# Patient Record
Sex: Male | Born: 1940 | Race: White | Hispanic: No | Marital: Married | Smoking: Former smoker
Health system: Southern US, Community
[De-identification: ages and names within clinical notes are randomized; demographics above are authoritative.]

## PROBLEM LIST (undated history)

## (undated) DIAGNOSIS — E119 Type 2 diabetes mellitus without complications: Secondary | ICD-10-CM

---

## 2020-09-16 ENCOUNTER — Emergency Department: Payer: Medicare (Managed Care)

## 2020-09-16 ENCOUNTER — Encounter: Payer: Self-pay | Admitting: Emergency Medicine

## 2020-09-16 ENCOUNTER — Inpatient Hospital Stay
Admission: EM | Admit: 2020-09-16 | Discharge: 2020-09-30 | DRG: 853 | Disposition: A | Discharge status: Home / Self Care | Payer: Medicare (Managed Care) | Attending: Family Medicine | Admitting: Family Medicine

## 2020-09-16 ENCOUNTER — Other Ambulatory Visit: Payer: Self-pay

## 2020-09-16 DIAGNOSIS — C7A1 Malignant poorly differentiated neuroendocrine tumors: Secondary | ICD-10-CM | POA: Diagnosis present

## 2020-09-16 DIAGNOSIS — R918 Other nonspecific abnormal finding of lung field: Secondary | ICD-10-CM

## 2020-09-16 DIAGNOSIS — R1900 Intra-abdominal and pelvic swelling, mass and lump, unspecified site: Secondary | ICD-10-CM

## 2020-09-16 DIAGNOSIS — R591 Generalized enlarged lymph nodes: Secondary | ICD-10-CM

## 2020-09-16 DIAGNOSIS — C7931 Secondary malignant neoplasm of brain: Secondary | ICD-10-CM | POA: Diagnosis present

## 2020-09-16 DIAGNOSIS — N390 Urinary tract infection, site not specified: Secondary | ICD-10-CM | POA: Diagnosis present

## 2020-09-16 DIAGNOSIS — C774 Secondary and unspecified malignant neoplasm of inguinal and lower limb lymph nodes: Secondary | ICD-10-CM | POA: Diagnosis present

## 2020-09-16 DIAGNOSIS — E222 Syndrome of inappropriate secretion of antidiuretic hormone: Secondary | ICD-10-CM | POA: Diagnosis present

## 2020-09-16 DIAGNOSIS — Z6820 Body mass index (BMI) 20.0-20.9, adult: Secondary | ICD-10-CM

## 2020-09-16 DIAGNOSIS — D649 Anemia, unspecified: Secondary | ICD-10-CM | POA: Diagnosis present

## 2020-09-16 DIAGNOSIS — C20 Malignant neoplasm of rectum: Secondary | ICD-10-CM | POA: Diagnosis present

## 2020-09-16 DIAGNOSIS — Z539 Procedure and treatment not carried out, unspecified reason: Secondary | ICD-10-CM | POA: Diagnosis present

## 2020-09-16 DIAGNOSIS — R4182 Altered mental status, unspecified: Secondary | ICD-10-CM | POA: Diagnosis not present

## 2020-09-16 DIAGNOSIS — A419 Sepsis, unspecified organism: Secondary | ICD-10-CM

## 2020-09-16 DIAGNOSIS — A4102 Sepsis due to Methicillin resistant Staphylococcus aureus: Secondary | ICD-10-CM | POA: Diagnosis not present

## 2020-09-16 DIAGNOSIS — N136 Pyonephrosis: Secondary | ICD-10-CM | POA: Diagnosis present

## 2020-09-16 DIAGNOSIS — D509 Iron deficiency anemia, unspecified: Secondary | ICD-10-CM | POA: Diagnosis present

## 2020-09-16 DIAGNOSIS — G9341 Metabolic encephalopathy: Secondary | ICD-10-CM | POA: Diagnosis present

## 2020-09-16 DIAGNOSIS — R590 Localized enlarged lymph nodes: Secondary | ICD-10-CM

## 2020-09-16 DIAGNOSIS — I10 Essential (primary) hypertension: Secondary | ICD-10-CM | POA: Diagnosis present

## 2020-09-16 DIAGNOSIS — E1165 Type 2 diabetes mellitus with hyperglycemia: Secondary | ICD-10-CM | POA: Diagnosis present

## 2020-09-16 DIAGNOSIS — F05 Delirium due to known physiological condition: Secondary | ICD-10-CM | POA: Diagnosis present

## 2020-09-16 DIAGNOSIS — Z87891 Personal history of nicotine dependence: Secondary | ICD-10-CM

## 2020-09-16 DIAGNOSIS — E871 Hypo-osmolality and hyponatremia: Secondary | ICD-10-CM

## 2020-09-16 DIAGNOSIS — K449 Diaphragmatic hernia without obstruction or gangrene: Secondary | ICD-10-CM | POA: Diagnosis present

## 2020-09-16 DIAGNOSIS — Z20822 Contact with and (suspected) exposure to covid-19: Secondary | ICD-10-CM | POA: Diagnosis present

## 2020-09-16 DIAGNOSIS — Z515 Encounter for palliative care: Secondary | ICD-10-CM

## 2020-09-16 DIAGNOSIS — E43 Unspecified severe protein-calorie malnutrition: Secondary | ICD-10-CM | POA: Diagnosis present

## 2020-09-16 DIAGNOSIS — N1330 Unspecified hydronephrosis: Secondary | ICD-10-CM

## 2020-09-16 DIAGNOSIS — C61 Malignant neoplasm of prostate: Secondary | ICD-10-CM | POA: Diagnosis present

## 2020-09-16 DIAGNOSIS — R6520 Severe sepsis without septic shock: Secondary | ICD-10-CM | POA: Diagnosis present

## 2020-09-16 DIAGNOSIS — F0390 Unspecified dementia without behavioral disturbance: Secondary | ICD-10-CM | POA: Diagnosis present

## 2020-09-16 DIAGNOSIS — R339 Retention of urine, unspecified: Secondary | ICD-10-CM

## 2020-09-16 DIAGNOSIS — R410 Disorientation, unspecified: Secondary | ICD-10-CM

## 2020-09-16 DIAGNOSIS — E876 Hypokalemia: Secondary | ICD-10-CM | POA: Diagnosis not present

## 2020-09-16 HISTORY — DX: Type 2 diabetes mellitus without complications: E11.9

## 2020-09-16 LAB — CBC
HCT: 29.3 % — ABNORMAL LOW (ref 39.0–52.0)
Hemoglobin: 8.8 g/dL — ABNORMAL LOW (ref 13.0–17.0)
MCH: 23.5 pg — ABNORMAL LOW (ref 26.0–34.0)
MCHC: 30 g/dL (ref 30.0–36.0)
MCV: 78.1 fL — ABNORMAL LOW (ref 80.0–100.0)
Platelets: 353 10*3/uL (ref 150–400)
RBC: 3.75 MIL/uL — ABNORMAL LOW (ref 4.22–5.81)
RDW: 16.7 % — ABNORMAL HIGH (ref 11.5–15.5)
WBC: 14.6 10*3/uL — ABNORMAL HIGH (ref 4.0–10.5)
nRBC: 0 % (ref 0.0–0.2)

## 2020-09-16 LAB — COMPREHENSIVE METABOLIC PANEL
ALT: 13 U/L (ref 0–44)
AST: 21 U/L (ref 15–41)
Albumin: 2.7 g/dL — ABNORMAL LOW (ref 3.5–5.0)
Alkaline Phosphatase: 108 U/L (ref 38–126)
Anion gap: 10 (ref 5–15)
BUN: 20 mg/dL (ref 8–23)
CO2: 23 mmol/L (ref 22–32)
Calcium: 8.2 mg/dL — ABNORMAL LOW (ref 8.9–10.3)
Chloride: 99 mmol/L (ref 98–111)
Creatinine, Ser: 1.03 mg/dL (ref 0.61–1.24)
GFR, Estimated: >60 mL/min (ref >60)
Glucose, Bld: 170 mg/dL — ABNORMAL HIGH (ref 70–99)
Potassium: 3.5 mmol/L (ref 3.5–5.1)
Sodium: 132 mmol/L — ABNORMAL LOW (ref 135–145)
Total Bilirubin: 0.6 mg/dL (ref 0.3–1.2)
Total Protein: 8.1 g/dL (ref 6.5–8.1)

## 2020-09-16 LAB — RESP PANEL BY RT-PCR (FLU A&B, COVID) ARPGX2
Influenza A by PCR: NEGATIVE
Influenza B by PCR: NEGATIVE
SARS Coronavirus 2 by RT PCR: NEGATIVE

## 2020-09-16 LAB — URINALYSIS, COMPLETE (UACMP) WITH MICROSCOPIC
Bilirubin Urine: NEGATIVE
Glucose, UA: NEGATIVE mg/dL
Ketones, ur: NEGATIVE mg/dL
Nitrite: NEGATIVE
Protein, ur: >=300 mg/dL — AB
Specific Gravity, Urine: 1.027 (ref 1.005–1.030)
Squamous Epithelial / HPF: NONE SEEN (ref 0–5)
pH: 5 (ref 5.0–8.0)

## 2020-09-16 LAB — ETHANOL: Alcohol, Ethyl (B): <10 mg/dL (ref <10)

## 2020-09-16 LAB — LACTIC ACID, PLASMA: Lactic Acid, Venous: 1.8 mmol/L (ref 0.5–1.9)

## 2020-09-16 MED ORDER — LACTATED RINGERS IV BOLUS (SEPSIS)
1000.0000 mL | Freq: Once | INTRAVENOUS | Status: AC
  Administered 2020-09-16: 1000 mL via INTRAVENOUS

## 2020-09-16 MED ORDER — INSULIN ASPART 100 UNIT/ML ~~LOC~~ SOLN
0.0000 [IU] | Freq: Three times a day (TID) | SUBCUTANEOUS | Status: DC
  Administered 2020-09-17 – 2020-09-21 (×3): 1 [IU] via SUBCUTANEOUS
  Administered 2020-09-21 – 2020-09-23 (×2): 2 [IU] via SUBCUTANEOUS
  Administered 2020-09-23: 1 [IU] via SUBCUTANEOUS
  Administered 2020-09-24: 2 [IU] via SUBCUTANEOUS
  Administered 2020-09-24 (×2): 5 [IU] via SUBCUTANEOUS
  Administered 2020-09-25: 3 [IU] via SUBCUTANEOUS
  Administered 2020-09-25: 1 [IU] via SUBCUTANEOUS
  Administered 2020-09-25 – 2020-09-26 (×2): 3 [IU] via SUBCUTANEOUS
  Administered 2020-09-26: 5 [IU] via SUBCUTANEOUS
  Administered 2020-09-26: 2 [IU] via SUBCUTANEOUS
  Administered 2020-09-27: 9 [IU] via SUBCUTANEOUS
  Administered 2020-09-27 – 2020-09-28 (×3): 2 [IU] via SUBCUTANEOUS
  Administered 2020-09-28: 3 [IU] via SUBCUTANEOUS
  Filled 2020-09-16 (×19): qty 1

## 2020-09-16 MED ORDER — SODIUM CHLORIDE 0.9 % IV SOLN
1.0000 g | INTRAVENOUS | Status: DC
  Administered 2020-09-16 – 2020-09-20 (×5): 1 g via INTRAVENOUS
  Filled 2020-09-16 (×3): qty 10
  Filled 2020-09-16 (×2): qty 1
  Filled 2020-09-16: qty 10

## 2020-09-16 MED ORDER — ONDANSETRON HCL 4 MG PO TABS: 4.0000 mg | ORAL_TABLET | Freq: Four times a day (QID) | ORAL | Status: DC | PRN

## 2020-09-16 MED ORDER — ACETAMINOPHEN 325 MG PO TABS
650.0000 mg | ORAL_TABLET | Freq: Four times a day (QID) | ORAL | Status: DC | PRN
  Administered 2020-09-17 – 2020-09-29 (×16): 650 mg via ORAL
  Filled 2020-09-16 (×18): qty 2

## 2020-09-16 MED ORDER — ONDANSETRON HCL 4 MG/2ML IJ SOLN: 4.0000 mg | Freq: Four times a day (QID) | INTRAMUSCULAR | Status: DC | PRN

## 2020-09-16 MED ORDER — LACTATED RINGERS IV BOLUS (SEPSIS)
250.0000 mL | Freq: Once | INTRAVENOUS | Status: AC
  Administered 2020-09-16: 250 mL via INTRAVENOUS

## 2020-09-16 MED ORDER — ACETAMINOPHEN 650 MG RE SUPP: 650.0000 mg | Freq: Four times a day (QID) | RECTAL | Status: DC | PRN

## 2020-09-16 MED ORDER — ENOXAPARIN SODIUM 40 MG/0.4ML ~~LOC~~ SOLN
40.0000 mg | SUBCUTANEOUS | Status: DC
  Administered 2020-09-17 – 2020-09-24 (×8): 40 mg via SUBCUTANEOUS
  Filled 2020-09-16 (×8): qty 0.4

## 2020-09-16 MED ORDER — ACETAMINOPHEN 325 MG PO TABS
ORAL_TABLET | ORAL | Status: AC
  Administered 2020-09-17: 650 mg via ORAL
  Filled 2020-09-16: qty 2

## 2020-09-16 MED ORDER — ACETAMINOPHEN 325 MG PO TABS
650.0000 mg | ORAL_TABLET | Freq: Once | ORAL | Status: AC
  Administered 2020-09-16: 650 mg via ORAL
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
## 2020-09-16 NOTE — ED Notes (Signed)
Pt ambulatory to toilet without difficulty. Pt back in bed given pillow and warm blankets. Lights dimmed for rest. Pt denies further needs at this time.
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
## 2020-09-16 NOTE — ED Notes (Signed)
Pt transported to CT ?

## 2020-09-16 NOTE — Consult Note (Signed)
CODE SEPSIS - PHARMACY COMMUNICATION  **Broad Spectrum Antibiotics should be administered within 1 hour of Sepsis diagnosis**  Time Code Sepsis Called/Page Received: 2034  Antibiotics Ordered: 2033  Time of 1st antibiotic administration: 2047  Additional action taken by pharmacy: N/A  If necessary, Name of Provider/Nurse Contacted: N/A    Darnelle Bos ,PharmD Clinical Pharmacist  09/16/2020  8:36 PM
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
## 2020-09-17 DIAGNOSIS — G9341 Metabolic encephalopathy: Secondary | ICD-10-CM | POA: Diagnosis present

## 2020-09-17 DIAGNOSIS — E222 Syndrome of inappropriate secretion of antidiuretic hormone: Secondary | ICD-10-CM | POA: Diagnosis present

## 2020-09-17 DIAGNOSIS — I38 Endocarditis, valve unspecified: Secondary | ICD-10-CM | POA: Diagnosis not present

## 2020-09-17 DIAGNOSIS — C774 Secondary and unspecified malignant neoplasm of inguinal and lower limb lymph nodes: Secondary | ICD-10-CM | POA: Diagnosis present

## 2020-09-17 DIAGNOSIS — Z6820 Body mass index (BMI) 20.0-20.9, adult: Secondary | ICD-10-CM | POA: Diagnosis not present

## 2020-09-17 DIAGNOSIS — R7881 Bacteremia: Secondary | ICD-10-CM | POA: Diagnosis not present

## 2020-09-17 DIAGNOSIS — R410 Disorientation, unspecified: Secondary | ICD-10-CM | POA: Diagnosis not present

## 2020-09-17 DIAGNOSIS — C7A1 Malignant poorly differentiated neuroendocrine tumors: Secondary | ICD-10-CM | POA: Diagnosis present

## 2020-09-17 DIAGNOSIS — Z515 Encounter for palliative care: Secondary | ICD-10-CM | POA: Diagnosis not present

## 2020-09-17 DIAGNOSIS — D509 Iron deficiency anemia, unspecified: Secondary | ICD-10-CM

## 2020-09-17 DIAGNOSIS — E871 Hypo-osmolality and hyponatremia: Secondary | ICD-10-CM | POA: Diagnosis not present

## 2020-09-17 DIAGNOSIS — A4102 Sepsis due to Methicillin resistant Staphylococcus aureus: Secondary | ICD-10-CM | POA: Diagnosis present

## 2020-09-17 DIAGNOSIS — E876 Hypokalemia: Secondary | ICD-10-CM | POA: Diagnosis not present

## 2020-09-17 DIAGNOSIS — B9562 Methicillin resistant Staphylococcus aureus infection as the cause of diseases classified elsewhere: Secondary | ICD-10-CM

## 2020-09-17 DIAGNOSIS — Z87891 Personal history of nicotine dependence: Secondary | ICD-10-CM | POA: Diagnosis not present

## 2020-09-17 DIAGNOSIS — N132 Hydronephrosis with renal and ureteral calculous obstruction: Secondary | ICD-10-CM | POA: Diagnosis not present

## 2020-09-17 DIAGNOSIS — R6520 Severe sepsis without septic shock: Secondary | ICD-10-CM | POA: Diagnosis present

## 2020-09-17 DIAGNOSIS — D649 Anemia, unspecified: Secondary | ICD-10-CM

## 2020-09-17 DIAGNOSIS — Z20822 Contact with and (suspected) exposure to covid-19: Secondary | ICD-10-CM | POA: Diagnosis present

## 2020-09-17 DIAGNOSIS — R339 Retention of urine, unspecified: Secondary | ICD-10-CM | POA: Diagnosis not present

## 2020-09-17 DIAGNOSIS — N390 Urinary tract infection, site not specified: Secondary | ICD-10-CM

## 2020-09-17 DIAGNOSIS — G9340 Encephalopathy, unspecified: Secondary | ICD-10-CM | POA: Diagnosis not present

## 2020-09-17 DIAGNOSIS — Z539 Procedure and treatment not carried out, unspecified reason: Secondary | ICD-10-CM | POA: Diagnosis present

## 2020-09-17 DIAGNOSIS — A419 Sepsis, unspecified organism: Secondary | ICD-10-CM | POA: Diagnosis not present

## 2020-09-17 DIAGNOSIS — C61 Malignant neoplasm of prostate: Secondary | ICD-10-CM | POA: Diagnosis present

## 2020-09-17 DIAGNOSIS — R4182 Altered mental status, unspecified: Secondary | ICD-10-CM | POA: Diagnosis present

## 2020-09-17 DIAGNOSIS — I10 Essential (primary) hypertension: Secondary | ICD-10-CM | POA: Diagnosis present

## 2020-09-17 DIAGNOSIS — F05 Delirium due to known physiological condition: Secondary | ICD-10-CM | POA: Diagnosis present

## 2020-09-17 DIAGNOSIS — N1330 Unspecified hydronephrosis: Secondary | ICD-10-CM | POA: Diagnosis not present

## 2020-09-17 DIAGNOSIS — E1165 Type 2 diabetes mellitus with hyperglycemia: Secondary | ICD-10-CM | POA: Diagnosis present

## 2020-09-17 DIAGNOSIS — F0390 Unspecified dementia without behavioral disturbance: Secondary | ICD-10-CM | POA: Diagnosis present

## 2020-09-17 DIAGNOSIS — C20 Malignant neoplasm of rectum: Secondary | ICD-10-CM | POA: Diagnosis present

## 2020-09-17 DIAGNOSIS — R918 Other nonspecific abnormal finding of lung field: Secondary | ICD-10-CM | POA: Diagnosis not present

## 2020-09-17 DIAGNOSIS — N136 Pyonephrosis: Secondary | ICD-10-CM | POA: Diagnosis present

## 2020-09-17 DIAGNOSIS — E43 Unspecified severe protein-calorie malnutrition: Secondary | ICD-10-CM | POA: Diagnosis present

## 2020-09-17 DIAGNOSIS — K449 Diaphragmatic hernia without obstruction or gangrene: Secondary | ICD-10-CM | POA: Diagnosis present

## 2020-09-17 DIAGNOSIS — C7931 Secondary malignant neoplasm of brain: Secondary | ICD-10-CM | POA: Diagnosis present

## 2020-09-17 LAB — BLOOD CULTURE ID PANEL (REFLEXED) - BCID2

## 2020-09-17 LAB — URINE DRUG SCREEN, QUALITATIVE (ARMC ONLY)
Amphetamines, Ur Screen: NOT DETECTED
Barbiturates, Ur Screen: NOT DETECTED
Benzodiazepine, Ur Scrn: NOT DETECTED
Cannabinoid 50 Ng, Ur ~~LOC~~: NOT DETECTED
Cocaine Metabolite,Ur ~~LOC~~: NOT DETECTED
MDMA (Ecstasy)Ur Screen: NOT DETECTED
Methadone Scn, Ur: NOT DETECTED
Opiate, Ur Screen: NOT DETECTED
Phencyclidine (PCP) Ur S: NOT DETECTED
Tricyclic, Ur Screen: NOT DETECTED

## 2020-09-17 LAB — IRON AND TIBC
Iron: 14 ug/dL — ABNORMAL LOW (ref 45–182)
Saturation Ratios: 13 % — ABNORMAL LOW (ref 17.9–39.5)
TIBC: 111 ug/dL — ABNORMAL LOW (ref 250–450)
UIBC: 97 ug/dL

## 2020-09-17 LAB — HEMOGLOBIN A1C
Hgb A1c MFr Bld: 6.2 % — ABNORMAL HIGH (ref 4.8–5.6)
Mean Plasma Glucose: 131.24 mg/dL

## 2020-09-17 LAB — CBC
HCT: 28.6 % — ABNORMAL LOW (ref 39.0–52.0)
Hemoglobin: 8.5 g/dL — ABNORMAL LOW (ref 13.0–17.0)
MCH: 23.4 pg — ABNORMAL LOW (ref 26.0–34.0)
MCHC: 29.7 g/dL — ABNORMAL LOW (ref 30.0–36.0)
MCV: 78.8 fL — ABNORMAL LOW (ref 80.0–100.0)
Platelets: 249 10*3/uL (ref 150–400)
RBC: 3.63 MIL/uL — ABNORMAL LOW (ref 4.22–5.81)
RDW: 16.7 % — ABNORMAL HIGH (ref 11.5–15.5)
WBC: 14.8 10*3/uL — ABNORMAL HIGH (ref 4.0–10.5)
nRBC: 0 % (ref 0.0–0.2)

## 2020-09-17 LAB — CBG MONITORING, ED: Glucose-Capillary: 133 mg/dL — ABNORMAL HIGH (ref 70–99)

## 2020-09-17 LAB — GLUCOSE, CAPILLARY
Glucose-Capillary: 78 mg/dL (ref 70–99)
Glucose-Capillary: 124 mg/dL — ABNORMAL HIGH (ref 70–99)
Glucose-Capillary: 141 mg/dL — ABNORMAL HIGH (ref 70–99)

## 2020-09-17 LAB — BASIC METABOLIC PANEL
Anion gap: 9 (ref 5–15)
BUN: 17 mg/dL (ref 8–23)
CO2: 22 mmol/L (ref 22–32)
Calcium: 7.8 mg/dL — ABNORMAL LOW (ref 8.9–10.3)
Chloride: 102 mmol/L (ref 98–111)
Creatinine, Ser: 0.92 mg/dL (ref 0.61–1.24)
GFR, Estimated: >60 mL/min (ref >60)
Glucose, Bld: 150 mg/dL — ABNORMAL HIGH (ref 70–99)
Potassium: 3.6 mmol/L (ref 3.5–5.1)
Sodium: 133 mmol/L — ABNORMAL LOW (ref 135–145)

## 2020-09-17 LAB — VITAMIN B12: Vitamin B-12: 579 pg/mL (ref 180–914)

## 2020-09-17 LAB — PROCALCITONIN: Procalcitonin: 0.92 ng/mL

## 2020-09-17 LAB — FERRITIN: Ferritin: 1133 ng/mL — ABNORMAL HIGH (ref 24–336)

## 2020-09-17 LAB — FOLATE: Folate: 11.3 ng/mL (ref >5.9)

## 2020-09-17 MED ORDER — VANCOMYCIN HCL 1500 MG/300ML IV SOLN
1500.0000 mg | INTRAVENOUS | Status: AC
  Administered 2020-09-17: 1500 mg via INTRAVENOUS
  Filled 2020-09-17: qty 300

## 2020-09-17 MED ORDER — METOPROLOL TARTRATE 5 MG/5ML IV SOLN
2.5000 mg | Freq: Once | INTRAVENOUS | Status: AC
  Administered 2020-09-17: 2.5 mg via INTRAVENOUS
  Filled 2020-09-17: qty 5

## 2020-09-17 MED ORDER — VANCOMYCIN HCL 750 MG/150ML IV SOLN
750.0000 mg | Freq: Two times a day (BID) | INTRAVENOUS | Status: DC
  Administered 2020-09-17 – 2020-09-19 (×5): 750 mg via INTRAVENOUS
  Filled 2020-09-17 (×8): qty 150

## 2020-09-17 MED ORDER — CHLORHEXIDINE GLUCONATE CLOTH 2 % EX PADS
6.0000 | MEDICATED_PAD | Freq: Every day | CUTANEOUS | Status: DC
  Administered 2020-09-18 – 2020-09-27 (×10): 6 via TOPICAL

## 2020-09-17 MED ORDER — ACETAMINOPHEN 325 MG PO TABS
325.0000 mg | ORAL_TABLET | Freq: Once | ORAL | Status: AC
  Administered 2020-09-17: 325 mg via ORAL
  Filled 2020-09-17: qty 1

## 2020-09-17 MED ORDER — SODIUM CHLORIDE 0.9 % IV BOLUS
250.0000 mL | Freq: Once | INTRAVENOUS | Status: AC
  Administered 2020-09-17: 250 mL via INTRAVENOUS

## 2020-09-17 NOTE — Plan of Care (Signed)
  Problem: Education: Goal: Knowledge of General Education information will improve Description: Including pain rating scale, medication(s)/side effects and non-pharmacologic comfort measures Outcome: Progressing   Problem: Health Behavior/Discharge Planning: Goal: Ability to manage health-related needs will improve Outcome: Progressing   Problem: Clinical Measurements: Goal: Ability to maintain clinical measurements within normal limits will improve Outcome: Progressing Goal: Will remain free from infection Outcome: Progressing Goal: Diagnostic test results will improve Outcome: Progressing Goal: Respiratory complications will improve Outcome: Progressing Goal: Cardiovascular complication will be avoided Outcome: Progressing   Problem: Safety: Goal: Periods of time without injury will increase Outcome: Progressing   Problem: Physical Regulation: Goal: Ability to maintain clinical measurements within normal limits will improve Outcome: Progressing   Problem: Health Behavior/Discharge Planning: Goal: Identification of resources available to assist in meeting health care needs will improve Outcome: Progressing Goal: Compliance with treatment plan for underlying cause of condition will improve Outcome: Progressing   Problem: Coping: Goal: Ability to verbalize frustrations and anger appropriately will improve Outcome: Progressing Goal: Ability to demonstrate self-control will improve Outcome: Progressing   Problem: Activity: Goal: Interest or engagement in activities will improve Outcome: Progressing Goal: Sleeping patterns will improve Outcome: Progressing   Problem: Education: Goal: Knowledge of [place] General Education information/materials will improve Outcome: Progressing Goal: Emotional status will improve Outcome: Progressing Goal: Mental status will improve Outcome: Progressing Goal: Verbalization of understanding the information provided will  improve Outcome: Progressing
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
## 2020-09-17 NOTE — ED Notes (Signed)
Spoke with Mansy MD regarding pt increased heart rate, respirations, and temperature. Pt alert and continues to be confused. Tylenol administered prior to speaking with MD. EKG performed and exported. Pt in Af[facility] at this time. See new orders.
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
## 2020-09-17 NOTE — Consult Note (Signed)
Pharmacy Antibiotic Note  Rick Marquez is a 80 y.o. male admitted on 09/16/2020 with sepsis. Pt brought in altered and confused from [facility]. Pt is from [place] with incomplete PMH. PMH includes prostate cancer, diabetes and hypertension. Recent UTI per pt. Pharmacy has been consulted for vancomycin dosing.  Plan: Vancomycin 1500 mg IV x 1 loading dose, followed by vancomycin 750 mg q12h  --Predicted AUC 535.4, tr 16.4 --Monitor levels at steady state  --Monitor renal function with AM labs  Continue ceftriaxone 1g q24h  Height: 5\' 11"  (180.3 cm) Weight: 68 kg (150 lb) IBW/kg (Calculated) : 75.3  Temp (24hrs), Avg:100.3 F (37.9 C), Min:98.4 F (36.9 C), Max:103 F (39.4 C)  Recent Labs  Lab 09/16/20 1947 09/17/20 0336  WBC 14.6* 14.8*  CREATININE 1.03 0.92  LATICACIDVEN 1.8  --     Estimated Creatinine Clearance: 63.6 mL/min (by C-G formula based on SCr of 0.92 mg/dL).    Not on File  Antimicrobials this admission: 3/2 ceftriaxone >> 3/3 vancomycin >>  Microbiology results: 3/2 BCx: 2/4 bottles, 1/2 sets MRSA 3/2 UCx: pending  Thank you for allowing pharmacy to be a part of this patient's care.  Benn Moulder, PharmD Pharmacy Resident  09/17/2020 9:58 AM

## 2020-09-17 NOTE — Progress Notes (Signed)
PHARMACY - PHYSICIAN COMMUNICATION CRITICAL VALUE ALERT - BLOOD CULTURE IDENTIFICATION (BCID)  Rick Marquez is an 80 y.o. male who presented to [facility] on 09/16/2020 with a chief complaint of altered mental status   Assessment:  Patient found confused at [facility].  PMH of prostate cancer and chronic urinary catheter.  Blood cultures from 3/2 currently 1/2 sets with GPC, BCID = MRSA.  Currently on ceftriaxone  Name of physician (or Provider) Contacted: Dr Benny Lennert   Current antibiotics: Ceftriaxone  Changes to prescribed antibiotics recommended:  Recommendations accepted by provider - add vancomycin, ID to see  Results for orders placed or performed during the hospital encounter of 09/16/20  Blood Culture ID Panel (Reflexed) (Collected: 09/16/2020  8:40 PM)  Result Value Ref Range   Enterococcus faecalis NOT DETECTED NOT DETECTED   Enterococcus Faecium NOT DETECTED NOT DETECTED   Listeria monocytogenes NOT DETECTED NOT DETECTED   Staphylococcus species DETECTED (A) NOT DETECTED   Staphylococcus aureus (BCID) DETECTED (A) NOT DETECTED   Staphylococcus epidermidis NOT DETECTED NOT DETECTED   Staphylococcus lugdunensis NOT DETECTED NOT DETECTED   Streptococcus species NOT DETECTED NOT DETECTED   Streptococcus agalactiae NOT DETECTED NOT DETECTED   Streptococcus pneumoniae NOT DETECTED NOT DETECTED   Streptococcus pyogenes NOT DETECTED NOT DETECTED   A.calcoaceticus-baumannii NOT DETECTED NOT DETECTED   Bacteroides fragilis NOT DETECTED NOT DETECTED   Enterobacterales NOT DETECTED NOT DETECTED   Enterobacter cloacae complex NOT DETECTED NOT DETECTED   Escherichia coli NOT DETECTED NOT DETECTED   Klebsiella aerogenes NOT DETECTED NOT DETECTED   Klebsiella oxytoca NOT DETECTED NOT DETECTED   Klebsiella pneumoniae NOT DETECTED NOT DETECTED   Proteus species NOT DETECTED NOT DETECTED   Salmonella species NOT DETECTED NOT DETECTED   Serratia marcescens NOT DETECTED NOT DETECTED    Haemophilus influenzae NOT DETECTED NOT DETECTED   Neisseria meningitidis NOT DETECTED NOT DETECTED   Pseudomonas aeruginosa NOT DETECTED NOT DETECTED   Stenotrophomonas maltophilia NOT DETECTED NOT DETECTED   Candida albicans NOT DETECTED NOT DETECTED   Candida auris NOT DETECTED NOT DETECTED   Candida glabrata NOT DETECTED NOT DETECTED   Candida krusei NOT DETECTED NOT DETECTED   Candida parapsilosis NOT DETECTED NOT DETECTED   Candida tropicalis NOT DETECTED NOT DETECTED   Cryptococcus neoformans/gattii NOT DETECTED NOT DETECTED   Meth resistant mecA/C and MREJ DETECTED (A) NOT DETECTED    Doreene Eland, PharmD, BCPS.   Work Cell: [phone] 09/17/2020 9:20 AM

## 2020-09-17 NOTE — ED Notes (Signed)
IVC/pending psych consult 

## 2020-09-17 NOTE — ED Notes (Signed)
Urinary catheter leg bag changed by Beth NT.

## 2020-09-18 ENCOUNTER — Inpatient Hospital Stay (HOSPITAL_COMMUNITY)
Admit: 2020-09-18 | Discharge: 2020-09-18 | Disposition: A | Discharge status: Home / Self Care | Payer: Medicare (Managed Care) | Attending: Internal Medicine | Admitting: Internal Medicine

## 2020-09-18 DIAGNOSIS — R339 Retention of urine, unspecified: Secondary | ICD-10-CM | POA: Diagnosis not present

## 2020-09-18 DIAGNOSIS — A419 Sepsis, unspecified organism: Secondary | ICD-10-CM | POA: Diagnosis not present

## 2020-09-18 DIAGNOSIS — G9341 Metabolic encephalopathy: Secondary | ICD-10-CM | POA: Diagnosis not present

## 2020-09-18 DIAGNOSIS — D649 Anemia, unspecified: Secondary | ICD-10-CM | POA: Diagnosis not present

## 2020-09-18 DIAGNOSIS — B9562 Methicillin resistant Staphylococcus aureus infection as the cause of diseases classified elsewhere: Secondary | ICD-10-CM | POA: Diagnosis not present

## 2020-09-18 DIAGNOSIS — R410 Disorientation, unspecified: Secondary | ICD-10-CM

## 2020-09-18 DIAGNOSIS — R7881 Bacteremia: Secondary | ICD-10-CM

## 2020-09-18 DIAGNOSIS — N390 Urinary tract infection, site not specified: Secondary | ICD-10-CM | POA: Diagnosis not present

## 2020-09-18 LAB — ECHOCARDIOGRAM COMPLETE
AR max vel: 3.94 cm2
AV Area VTI: 4.6 cm2
AV Area mean vel: 4.4 cm2
AV Mean grad: 4 mmHg
AV Peak grad: 9.2 mmHg
Ao pk vel: 1.52 m/s
Area-P 1/2: 5.34 cm2
Height: 71 in
MV VTI: 6.3 cm2
P 1/2 time: 542 msec
S' Lateral: 3.7 cm
Weight: 2400 oz

## 2020-09-18 LAB — CREATININE, SERUM
Creatinine, Ser: 0.83 mg/dL (ref 0.61–1.24)
GFR, Estimated: >60 mL/min (ref >60)

## 2020-09-18 LAB — GLUCOSE, CAPILLARY
Glucose-Capillary: 79 mg/dL (ref 70–99)
Glucose-Capillary: 106 mg/dL — ABNORMAL HIGH (ref 70–99)
Glucose-Capillary: 102 mg/dL — ABNORMAL HIGH (ref 70–99)
Glucose-Capillary: 156 mg/dL — ABNORMAL HIGH (ref 70–99)

## 2020-09-18 LAB — URINE CULTURE

## 2020-09-18 MED ORDER — HYDROMORPHONE HCL 1 MG/ML IJ SOLN
1.0000 mg | Freq: Once | INTRAMUSCULAR | Status: AC
  Administered 2020-09-18: 1 mg via INTRAVENOUS
  Filled 2020-09-18: qty 1

## 2020-09-18 MED ORDER — SODIUM CHLORIDE 0.9 % IV SOLN
INTRAVENOUS | Status: DC | PRN
  Administered 2020-09-18 – 2020-09-26 (×7): 250 mL via INTRAVENOUS

## 2020-09-18 NOTE — Consult Note (Signed)
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 pressure 107/70, pulse (!) 108, temperature 97.6 F (36.4 C), temperature source Oral, resp. rate 17, height 5\' 11"  (1.803 m), weight 68 kg, SpO2 99 %.Body mass  index is 20.92 kg/m.  General Appearance: Casual  Eye Contact:  Fair  Speech:  Slow  Volume:  Decreased  Mood:  Euthymic  Affect:  Constricted  Thought Process:  Goal Directed  Orientation:  Negative  Thought Content:  Logical and Tangential  Suicidal Thoughts:  No  Homicidal Thoughts:  No  Memory:  Immediate;   Fair Recent;   Poor Remote;   Poor  Judgement:  Impaired  Insight:  Shallow  Psychomotor Activity:  Decreased  Concentration:  Concentration: Poor  Recall:  Poor  Fund of Knowledge:  Poor  Language:  Poor  Akathisia:  No  Handed:  Right  AIMS (if indicated):     Assets:  Desire for Improvement  ADL's:  Impaired  Cognition:  Impaired,  Mild and Moderate  Sleep:        Treatment Plan Summary: Plan Patient who presents with a spell of delirium of unclear etiology although it could be related to sepsis.  Here in the hospital he is being treated for sepsis.  Patient presents currently as mildly delirious but also markedly demented.  The nurse on duty reminds me that he just had some Dilaudid so that could be making him worse.  Apparently he was a little more alert earlier.  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  Patient at this point does not appear to be dangerous at all to himself not making any attempts to leave not aggressive.  I am going to take him off of involuntary commitment papers so that if his family arrives and is ready to take him home everything will be ready for that.  No indication at this point for any psychiatric medicine.  We will follow as needed.  Disposition: No evidence of imminent risk to self or others at present.   Patient does not meet criteria for psychiatric inpatient admission. Supportive therapy provided about ongoing stressors.  Alethia Berthold, MD 09/18/2020 7:07 PM
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
## 2020-09-18 NOTE — Progress Notes (Signed)
Pt's daughter arrived at 2215 requesting to pick up her father stating that she had driven from [place]. The daughter indicated that she was not able to wait until morning for the pt to be discharged by the Attending as there was no discharge orders in the pt chart. The daughter left for [facility] and declined to visit with her father given the Infection Precautions. Oncoming shift will be informed.

## 2020-09-18 NOTE — Plan of Care (Signed)
  Problem: Education: Goal: Knowledge of General Education information will improve Description: Including pain rating scale, medication(s)/side effects and non-pharmacologic comfort measures Outcome: Progressing   Problem: Health Behavior/Discharge Planning: Goal: Ability to manage health-related needs will improve Outcome: Progressing   Problem: Clinical Measurements: Goal: Ability to maintain clinical measurements within normal limits will improve Outcome: Progressing Goal: Will remain free from infection Outcome: Progressing Goal: Diagnostic test results will improve Outcome: Progressing Goal: Respiratory complications will improve Outcome: Progressing Goal: Cardiovascular complication will be avoided Outcome: Progressing   Problem: Activity: Goal: Risk for activity intolerance will decrease Outcome: Progressing   Problem: Nutrition: Goal: Adequate nutrition will be maintained Outcome: Progressing   Problem: Coping: Goal: Level of anxiety will decrease Outcome: Progressing   Problem: Elimination: Goal: Will not experience complications related to bowel motility Outcome: Progressing Goal: Will not experience complications related to urinary retention Outcome: Progressing   Problem: Pain Managment: Goal: General experience of comfort will improve Outcome: Progressing   Problem: Safety: Goal: Ability to remain free from injury will improve Outcome: Progressing   Problem: Skin Integrity: Goal: Risk for impaired skin integrity will decrease Outcome: Progressing   Problem: Education: Goal: Knowledge of [place] [facility] Education information/materials will improve Outcome: Progressing Goal: Emotional status will improve Outcome: Progressing Goal: Mental status will improve Outcome: Progressing Goal: Verbalization of understanding the information provided will improve Outcome: Progressing   Problem: Activity: Goal: Interest or engagement in activities will  improve Outcome: Progressing Goal: Sleeping patterns will improve Outcome: Progressing   Problem: Coping: Goal: Ability to verbalize frustrations and anger appropriately will improve Outcome: Progressing Goal: Ability to demonstrate self-control will improve Outcome: Progressing   Problem: Health Behavior/Discharge Planning: Goal: Identification of resources available to assist in meeting health care needs will improve Outcome: Progressing Goal: Compliance with treatment plan for underlying cause of condition will improve Outcome: Progressing   Problem: Physical Regulation: Goal: Ability to maintain clinical measurements within normal limits will improve Outcome: Progressing   Problem: Safety: Goal: Periods of time without injury will increase Outcome: Progressing   
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
## 2020-09-18 NOTE — Consult Note (Addendum)
Pharmacy Antibiotic Note  SKIPPY MARHEFKA is a 80 y.o. male admitted on 09/16/2020 with sepsis. Pt brought in altered and confused from [facility]. Pt is from [place] with incomplete PMH. PMH includes prostate cancer, diabetes and hypertension, unknown home medications. Recent UTI per pt, taking AZO. Pt has chronic foley cath, not replaced since admission. Pharmacy has been consulted for vancomycin dosing.  Day 2 abx  Plan: Continue vancomycin 750 mg q12h  --Predicted AUC 535.4, tr 16.4 --Levels ordered with 3/5 AM dose  pk @ 1230  Tr @ 2100 --Monitor renal function with AM labs  Continue ceftriaxone 1g q24h F/u TTE results, repeat BCx  Height: 5\' 11"  (180.3 cm) Weight: 68 kg (150 lb) IBW/kg (Calculated) : 75.3  Temp (24hrs), Avg:98.9 F (37.2 C), Min:97.7 F (36.5 C), Max:100.5 F (38.1 C)  Recent Labs  Lab 09/16/20 1947 09/17/20 0336 09/18/20 0442  WBC 14.6* 14.8*  --   CREATININE 1.03 0.92 0.83  LATICACIDVEN 1.8  --   --     Estimated Creatinine Clearance: 70.5 mL/min (by C-G formula based on SCr of 0.83 mg/dL).    Not on File  Antimicrobials this admission: 3/2 ceftriaxone >> 3/3 vancomycin >>  Microbiology results: 3/2 BCx: 2/4 bottles, 1/2 sets MRSA 3/2 UCx: multiple species  Thank you for allowing pharmacy to be a part of this patient's care.  Benn Moulder, PharmD Pharmacy Resident  09/18/2020 8:37 AM

## 2020-09-18 NOTE — Progress Notes (Signed)
*  PRELIMINARY RESULTS* Echocardiogram 2D Echocardiogram has been performed.  Rick Marquez 09/18/2020, 11:06 AM
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
## 2020-09-19 DIAGNOSIS — R7881 Bacteremia: Secondary | ICD-10-CM | POA: Diagnosis not present

## 2020-09-19 DIAGNOSIS — B9562 Methicillin resistant Staphylococcus aureus infection as the cause of diseases classified elsewhere: Secondary | ICD-10-CM | POA: Diagnosis not present

## 2020-09-19 LAB — CULTURE, BLOOD (ROUTINE X 2)
Special Requests: ADEQUATE
Special Requests: ADEQUATE

## 2020-09-19 LAB — CREATININE, SERUM
Creatinine, Ser: 0.76 mg/dL (ref 0.61–1.24)
GFR, Estimated: >60 mL/min (ref >60)

## 2020-09-19 LAB — GLUCOSE, CAPILLARY
Glucose-Capillary: 111 mg/dL — ABNORMAL HIGH (ref 70–99)
Glucose-Capillary: 138 mg/dL — ABNORMAL HIGH (ref 70–99)
Glucose-Capillary: 136 mg/dL — ABNORMAL HIGH (ref 70–99)
Glucose-Capillary: 120 mg/dL — ABNORMAL HIGH (ref 70–99)

## 2020-09-19 LAB — VANCOMYCIN, PEAK: Vancomycin Pk: 40 ug/mL (ref 30–40)
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
## 2020-09-20 DIAGNOSIS — N390 Urinary tract infection, site not specified: Secondary | ICD-10-CM | POA: Diagnosis not present

## 2020-09-20 DIAGNOSIS — G9341 Metabolic encephalopathy: Secondary | ICD-10-CM | POA: Diagnosis not present

## 2020-09-20 DIAGNOSIS — R7881 Bacteremia: Secondary | ICD-10-CM | POA: Diagnosis not present

## 2020-09-20 DIAGNOSIS — A419 Sepsis, unspecified organism: Secondary | ICD-10-CM | POA: Diagnosis not present

## 2020-09-20 LAB — CBC WITH DIFFERENTIAL/PLATELET
Abs Immature Granulocytes: 0.12 10*3/uL — ABNORMAL HIGH (ref 0.00–0.07)
Basophils Absolute: 0 10*3/uL (ref 0.0–0.1)
Basophils Relative: 0 %
Eosinophils Absolute: 0 10*3/uL (ref 0.0–0.5)
Eosinophils Relative: 0 %
HCT: 26.7 % — ABNORMAL LOW (ref 39.0–52.0)
Hemoglobin: 7.9 g/dL — ABNORMAL LOW (ref 13.0–17.0)
Immature Granulocytes: 1 %
Lymphocytes Relative: 17 %
Lymphs Abs: 1.6 10*3/uL (ref 0.7–4.0)
MCH: 23.4 pg — ABNORMAL LOW (ref 26.0–34.0)
MCHC: 29.6 g/dL — ABNORMAL LOW (ref 30.0–36.0)
MCV: 79 fL — ABNORMAL LOW (ref 80.0–100.0)
Monocytes Absolute: 0.5 10*3/uL (ref 0.1–1.0)
Monocytes Relative: 6 %
Neutro Abs: 7 10*3/uL (ref 1.7–7.7)
Neutrophils Relative %: 76 %
Platelets: 273 10*3/uL (ref 150–400)
RBC: 3.38 MIL/uL — ABNORMAL LOW (ref 4.22–5.81)
RDW: 16.7 % — ABNORMAL HIGH (ref 11.5–15.5)
WBC: 9.3 10*3/uL (ref 4.0–10.5)
nRBC: 0 % (ref 0.0–0.2)

## 2020-09-20 LAB — COMPREHENSIVE METABOLIC PANEL
ALT: 13 U/L (ref 0–44)
AST: 23 U/L (ref 15–41)
Albumin: 2 g/dL — ABNORMAL LOW (ref 3.5–5.0)
Alkaline Phosphatase: 169 U/L — ABNORMAL HIGH (ref 38–126)
Anion gap: 9 (ref 5–15)
BUN: 14 mg/dL (ref 8–23)
CO2: 26 mmol/L (ref 22–32)
Calcium: 7.9 mg/dL — ABNORMAL LOW (ref 8.9–10.3)
Chloride: 102 mmol/L (ref 98–111)
Creatinine, Ser: 0.72 mg/dL (ref 0.61–1.24)
GFR, Estimated: >60 mL/min (ref >60)
Glucose, Bld: 109 mg/dL — ABNORMAL HIGH (ref 70–99)
Potassium: 3.5 mmol/L (ref 3.5–5.1)
Sodium: 137 mmol/L (ref 135–145)
Total Bilirubin: 0.6 mg/dL (ref 0.3–1.2)
Total Protein: 6.4 g/dL — ABNORMAL LOW (ref 6.5–8.1)

## 2020-09-20 LAB — GLUCOSE, CAPILLARY
Glucose-Capillary: 112 mg/dL — ABNORMAL HIGH (ref 70–99)
Glucose-Capillary: 132 mg/dL — ABNORMAL HIGH (ref 70–99)
Glucose-Capillary: 141 mg/dL — ABNORMAL HIGH (ref 70–99)
Glucose-Capillary: 123 mg/dL — ABNORMAL HIGH (ref 70–99)

## 2020-09-20 LAB — VANCOMYCIN, TROUGH: Vancomycin Tr: 17 ug/mL (ref 15–20)

## 2020-09-20 MED ORDER — VANCOMYCIN HCL 500 MG/100ML IV SOLN
500.0000 mg | Freq: Two times a day (BID) | INTRAVENOUS | Status: AC
Start: 2020-09-20 — End: 2020-09-25
  Administered 2020-09-20 – 2020-09-25 (×11): 500 mg via INTRAVENOUS
  Filled 2020-09-20 (×12): qty 100
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
## 2020-09-20 NOTE — Consult Note (Signed)
Pharmacy Antibiotic Note  Rick Marquez is a 80 y.o. male admitted on 09/16/2020 with sepsis. Pt brought in altered and confused from [facility]. Pt is from [place] with incomplete PMH. PMH includes prostate cancer, diabetes and hypertension, unknown home medications. Recent UTI per pt, taking AZO. Pt has chronic foley cath, not replaced since admission. Pharmacy has been consulted for vancomycin dosing.  Day 2 abx  Plan: Continue vancomycin 750 mg q12h  --Predicted AUC 535.4, tr 16.4 --Levels ordered with 3/5 AM dose  pk @ 1230  Tr @ 2100 --Monitor renal function with AM labs  Continue ceftriaxone 1g q24h F/u TTE results, repeat BCx  3/5:   Vanc peak @ 1228 = 40          Vanc trough @ 2124 = 17          Calc AUC = 643.9   Will adjust dose to Vancomycin 500 mg IV Q12H to start on 3/6 @ 1100.  Predicted AUC = 429.2 Next peak on 3/7 @ 1230 Next trough on 3/7 @ 2230  Height: 5\' 11"  (180.3 cm) Weight: 68 kg (150 lb) IBW/kg (Calculated) : 75.3  Temp (24hrs), Avg:98 F (36.7 C), Min:97.8 F (36.6 C), Max:98.2 F (36.8 C)  Recent Labs  Lab 09/16/20 1947 09/17/20 0336 09/18/20 0442 09/19/20 0426 09/19/20 1228 09/19/20 2124  WBC 14.6* 14.8*  --   --   --   --   CREATININE 1.03 0.92 0.83 0.76  --   --   LATICACIDVEN 1.8  --   --   --   --   --   VANCOTROUGH  --   --   --   --   --  17  VANCOPEAK  --   --   --   --  40  --     Estimated Creatinine Clearance: 73.2 mL/min (by C-G formula based on SCr of 0.76 mg/dL).    Not on File  Antimicrobials this admission: 3/2 ceftriaxone >> 3/3 vancomycin >>  Microbiology results: 3/2 BCx: 2/4 bottles, 1/2 sets MRSA 3/2 UCx: multiple species  Thank you for allowing pharmacy to be a part of this patient's care.  Orene Desanctis, PharmD Pharmacy Resident  09/20/2020 2:14 AM

## 2020-09-21 ENCOUNTER — Inpatient Hospital Stay: Payer: Medicare (Managed Care)

## 2020-09-21 DIAGNOSIS — B9562 Methicillin resistant Staphylococcus aureus infection as the cause of diseases classified elsewhere: Secondary | ICD-10-CM | POA: Diagnosis not present

## 2020-09-21 DIAGNOSIS — D649 Anemia, unspecified: Secondary | ICD-10-CM | POA: Diagnosis not present

## 2020-09-21 DIAGNOSIS — A4102 Sepsis due to Methicillin resistant Staphylococcus aureus: Secondary | ICD-10-CM

## 2020-09-21 DIAGNOSIS — R7881 Bacteremia: Secondary | ICD-10-CM | POA: Diagnosis not present

## 2020-09-21 DIAGNOSIS — A419 Sepsis, unspecified organism: Secondary | ICD-10-CM | POA: Diagnosis not present

## 2020-09-21 DIAGNOSIS — G9341 Metabolic encephalopathy: Secondary | ICD-10-CM | POA: Diagnosis not present

## 2020-09-21 LAB — GLUCOSE, CAPILLARY
Glucose-Capillary: 125 mg/dL — ABNORMAL HIGH (ref 70–99)
Glucose-Capillary: 90 mg/dL (ref 70–99)
Glucose-Capillary: 157 mg/dL — ABNORMAL HIGH (ref 70–99)
Glucose-Capillary: 110 mg/dL — ABNORMAL HIGH (ref 70–99)

## 2020-09-21 LAB — IRON AND TIBC
Iron: 12 ug/dL — ABNORMAL LOW (ref 45–182)
Saturation Ratios: 11 % — ABNORMAL LOW (ref 17.9–39.5)
TIBC: 113 ug/dL — ABNORMAL LOW (ref 250–450)
UIBC: 101 ug/dL

## 2020-09-21 LAB — VITAMIN B12: Vitamin B-12: 706 pg/mL (ref 180–914)

## 2020-09-21 LAB — CREATININE, SERUM
Creatinine, Ser: 0.71 mg/dL (ref 0.61–1.24)
GFR, Estimated: >60 mL/min (ref >60)

## 2020-09-21 MED ORDER — SODIUM CHLORIDE 0.9 % IV SOLN
300.0000 mg | Freq: Once | INTRAVENOUS | Status: AC
  Administered 2020-09-21: 300 mg via INTRAVENOUS
  Filled 2020-09-21: qty 15

## 2020-09-21 MED ORDER — GADOBUTROL 1 MMOL/ML IV SOLN
7.0000 mL | Freq: Once | INTRAVENOUS | Status: AC | PRN
  Administered 2020-09-21: 7 mL via INTRAVENOUS

## 2020-09-21 MED ORDER — ENSURE ENLIVE PO LIQD
237.0000 mL | Freq: Two times a day (BID) | ORAL | Status: DC
  Administered 2020-09-21 – 2020-09-30 (×14): 237 mL via ORAL

## 2020-09-21 NOTE — Progress Notes (Signed)
    [facility] has been requested to perform a transesophageal echocardiogram on Rick Marquez for bacteremia.  After careful review of history and examination, the risks and benefits of transesophageal echocardiogram have been explained including risks of esophageal damage, perforation (1:10,000 risk), bleeding, pharyngeal hematoma as well as other potential complications associated with conscious sedation including aspiration, arrhythmia, respiratory failure and death. Patient A&O x2.  I spoke to the patient as well as his step-daughter, Sherilyn Cooter, over the phone [phone]).  Procedure, risks and benefits were explained. Alternatives to treatment were discussed, questions were answered. Patient and step-daughter willing to proceed.   Upon further investigation, it is unclear who is the patient's POA. There seems to be much confusion surrounding family and family members. I am unable to get a hold of patient's wife this morning. I spoke again to [facility] over the phone, and she does not think patient has POA. She says wife has level of dementia. Given that procedure is not emergent, we will hold on TEE until details are clear and a clear POA can be established.   Cadence Ninfa Meeker, PA-C  09/21/2020 3:32 PM

## 2020-09-21 NOTE — Progress Notes (Signed)
   Date of Admission:  09/16/2020     ID: Rick Marquez is a 80 y.o. male  Principal Problem:   Subacute delirium Active Problems:   Sepsis ([place])   Acute lower UTI   Anemia   Acute metabolic encephalopathy   Urinary retention   MRSA (methicillin resistant Staphylococcus aureus) septicemia (HCC)    Subjective: Says he is feeling better  Medications:  . Chlorhexidine Gluconate Cloth  6 each Topical Daily  . enoxaparin (LOVENOX) injection  40 mg Subcutaneous Q24H  . feeding supplement  237 mL Oral BID BM  . insulin aspart  0-9 Units Subcutaneous TID WC    Objective: Vital signs in last 24 hours: Temp:  [98.1 F (36.7 C)-98.6 F (37 C)] 98.3 F (36.8 C) (03/07 1132) Pulse Rate:  [45-83] 69 (03/07 1132) Resp:  [16-18] 18 (03/07 1132) BP: (108-136)/(61-80) 130/79 (03/07 1132) SpO2:  [98 %-100 %] 100 % (03/07 1132)  PHYSICAL EXAM:  General: Alert, cooperative, no distress, some memory issues Head: Normocephalic, without obvious abnormality, atraumatic. Eyes: Conjunctivae clear, anicteric sclerae. Pupils are equal ENT Nares normal. No drainage or sinus tenderness. Lips, mucosa, and tongue normal. No Thrush Neck: Supple, symmetrical, no adenopathy, thyroid: non tender no carotid bruit and no JVD. Back: No CVA tenderness. Lungs: Clear to auscultation bilaterally. No Wheezing or Rhonchi. No rales. Heart: Regular rate and rhythm, no murmur, rub or gallop. Abdomen: Soft, non-tender,not distended. Bowel sounds normal. No masses foley Extremities: atraumatic, no cyanosis. No edema. No clubbing Skin: No rashes or lesions. Or bruising Lymph: Cervical, supraclavicular normal. 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 2. Mild lateral recess and right neural foraminal stenosis at L3-4 and L4-5. 3. Moderate to severe left hydroureteronephrosis, incompletely evaluated.  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  Unclear source.  Could be from chronic Foley catheter.  MRI of the lumbar spine no infection CT of the cervical spine did not show any evidence of discitis or osteomyelitis. Patient needs TEE Repeat blood culture negative Continue vancomycin.  He will need for a minimum of 4 weeks.  Prostate cancer Chronic indwelling Foley catheter MRI of the lumbar spine showed left hydroureteronephrosis which was not completely evaluated.  Will need dedicated imaging.   Anemia: Getting blood transfusion.  Discussed the management with the patient and the hospitalist.

## 2020-09-21 NOTE — TOC Progression Note (Addendum)
Transition of Care [facility]) - Progression Note    Patient Details  Name: Rick Marquez MRN: [id number] Date of Birth: 10/22/1940  Transition [facility] Care [facility]) CM/SW [place], LCSW Phone Number: 09/21/2020, 9:48 AM  Clinical Narrative:   CSW left VM for [facility], requested a return call to discuss report made over weekend.  12:00- Spoke to [place]. She is going to touch base with [place] [employer] and will update CSW when she has more information.  3:10- Call from [facility] who had police in [place] go by address in chart which was not correct and the residents did not know who patient was. [place] PD gave patient's home address in [place] and a contact person- [employer] Milinda Antis is now [employer] and will follow up with this CSW.        Expected Discharge Plan and Services                                                 Social Determinants of Health (SDOH) Interventions    Readmission Risk Interventions No flowsheet data found.

## 2020-09-21 NOTE — Progress Notes (Signed)
PROGRESS NOTE    Rick Marquez  OFB:[id number] DOB: 1940/11/15 DOA: 09/16/2020 PCP: Patient, No Pcp Per   Chief Complaint.  Altered mental status Brief Narrative:  Rick Marquez a 80 y.o.malewithunknown medical history who presents to the ED for evaluation of confusion. History is limited from patient due to confusion.  In the emergency room, he met sepsis criteria with tachycardia, tachypnea and leukocytosis.  His blood culture was positive for MRSA.  He is covered with vancomycin.  Assessment & Plan:   Principal Problem:   Subacute delirium Active Problems:   Sepsis ([place])   Acute lower UTI   Anemia   Acute metabolic encephalopathy   Urinary retention  #1.  Sepsis with MRSA septicemia. Acute metabolic cephalopathy. Etiology of bacteremia is still unclear.  Patient does not have any skin infection, no dental infection.  No evidence of pneumonia. Patient has been followed by infectious disease, TTE is pending. Appreciated ID consult, continue IV vancomycin. I will also obtain MRI of the lumbar spine to rule out osteomyelitis.  #2.  Iron deficient anemia. Patient hemoglobin 7.9, significant iron deficiency, B12 level pending.  We will give IV iron.  3.  Severe protein calorie malnutrition. Patient appears to be significantly malnourished, will give protein supplements.  4. UTI. On Rocephin  #5.  Hyponatremia. Condition improved     DVT prophylaxis: Lovenox Code Status: Full Family Communication:  Disposition Plan:  .   Status is: Inpatient  Remains inpatient appropriate because:Inpatient level of care appropriate due to severity of illness   Dispo: The patient is from: Home              Anticipated d/c is to: Home              Patient currently is not medically stable to d/c.   Difficult to place patient No        I/O last 3 completed shifts: In: 150 [IV Piggyback:150] Out: 1800 [Urine:1800] No intake/output data recorded.     Consultants:    ID  Procedures: None  Antimicrobials:  Vancomycin  Subjective: Patient currently complaining of lower back pain, worse at nighttime and with exertion.  It happened after admitted to the hospital. Denies any short of breath or cough. No abdominal pain or nausea vomiting.  No diarrhea No chest pain or palpitation  Objective: Vitals:   09/21/20 0033 09/21/20 0420 09/21/20 0748 09/21/20 1132  BP: 127/61 136/65 125/80 130/79  Pulse: 83 (!) 45 65 69  Resp: _0 Temp: 98.5 F (36.9 C) 98.2 F (36.8 C) 98.6 F (37 C) 98.3 F (36.8 C)  TempSrc: Oral Oral    SpO2: 98% 100% 99% 100%  Weight:      Height:        Intake/Output Summary (Last 24 hours) at 09/21/2020 1239 Last data filed at 09/20/2020 2241 Gross per 24 hour  Intake 150 ml  Output 300 ml  Net -150 ml   Filed Weights   09/16/20 1940  Weight: 68 kg    Examination:  General exam: Appears calm and comfortable  Respiratory system: Clear to auscultation. Respiratory effort normal. Cardiovascular system: S1 & S2 heard, RRR. No JVD, murmurs, rubs, gallops or clicks. No pedal edema. Gastrointestinal system: Abdomen is nondistended, soft and nontender. No organomegaly or masses felt. Normal bowel sounds heard. Central nervous system: Alert and oriented. No focal neurological deficits. Extremities: Symmetric 5 x 5 power. Skin: No rashes, lesions or ulcers Psychiatry: Judgement and insight  appear normal. Mood & affect appropriate.  Tenderness in the lower lumbar spine.    Data Reviewed: I have personally reviewed following labs and imaging studies  CBC: Recent Labs  Lab 09/16/20 1947 09/17/20 0336 09/20/20 0445  WBC 14.6* 14.8* 9.3  NEUTROABS  --   --  7.0  HGB 8.8* 8.5* 7.9*  HCT 29.3* 28.6* 26.7*  MCV 78.1* 78.8* 79.0*  PLT 353 249 371   Basic Metabolic Panel: Recent Labs  Lab 09/16/20 1947 09/17/20 0336 09/18/20 0442 09/19/20 0426 09/20/20 0445 09/21/20 0459  NA 132* 133*  --   --  137   --   K 3.5 3.6  --   --  3.5  --   CL 99 102  --   --  102  --   CO2 23 22  --   --  26  --   GLUCOSE 170* 150*  --   --  109*  --   BUN 20 17  --   --  14  --   CREATININE 1.03 0.92 0.83 0.76 0.72 0.71  CALCIUM 8.2* 7.8*  --   --  7.9*  --    GFR: Estimated Creatinine Clearance: 73.2 mL/min (by C-G formula based on SCr of 0.71 mg/dL). Liver Function Tests: Recent Labs  Lab 09/16/20 1947 09/20/20 0445  AST 21 23  ALT 13 13  ALKPHOS 108 169*  BILITOT 0.6 0.6  PROT 8.1 6.4*  ALBUMIN 2.7* 2.0*   No results for input(s): LIPASE, AMYLASE in the last 168 hours. No results for input(s): AMMONIA in the last 168 hours. Coagulation Profile: No results for input(s): INR, PROTIME in the last 168 hours. Cardiac Enzymes: No results for input(s): CKTOTAL, CKMB, CKMBINDEX, TROPONINI in the last 168 hours. BNP (last 3 results) No results for input(s): PROBNP in the last 8760 hours. HbA1C: No results for input(s): HGBA1C in the last 72 hours. CBG: Recent Labs  Lab 09/20/20 1209 09/20/20 1637 09/20/20 2103 09/21/20 0750 09/21/20 1131  GLUCAP 132* 141* 123* 125* 90   Lipid Profile: No results for input(s): CHOL, HDL, LDLCALC, TRIG, CHOLHDL, LDLDIRECT in the last 72 hours. Thyroid Function Tests: No results for input(s): TSH, T4TOTAL, FREET4, T3FREE, THYROIDAB in the last 72 hours. Anemia Panel: Recent Labs    09/21/20 0801  TIBC 113*  IRON 12*   Sepsis Labs: Recent Labs  Lab 09/16/20 1947 09/17/20 0336  PROCALCITON  --  0.92  LATICACIDVEN 1.8  --     Recent Results (from the past 240 hour(s))  Urine culture     Status: Abnormal   Collection Time: 09/16/20  7:47 PM   Specimen: Urine, Random  Result Value Ref Range Status   Specimen Description   Final    URINE, RANDOM Performed at [facility], [address] [place] [address], [place], [place] [address]    Special Requests   Final    NONE Performed at [facility], [address] [place], [place] [address]     Culture MULTIPLE SPECIES PRESENT, SUGGEST RECOLLECTION (A)  Final   Report Status 09/18/2020 FINAL  Final  Blood culture (routine x 2)     Status: Abnormal   Collection Time: 09/16/20  7:47 PM   Specimen: BLOOD  Result Value Ref Range Status   Specimen Description   Final    BLOOD RIGHT ANTECUBITAL Performed at [facility], [address] [place], [place] [address]    Special Requests   Final    BOTTLES  DRAWN AEROBIC AND ANAEROBIC Blood Culture adequate volume Performed at [facility], [address] [place], [place] [address]    Culture  Setup Time   Final    GRAM POSITIVE COCCI IN BOTH AEROBIC AND ANAEROBIC BOTTLES CRITICAL VALUE NOTED.  VALUE IS CONSISTENT WITH PREVIOUSLY REPORTED AND CALLED VALUE. Performed at [facility], [address] [place], [place] [address]    Culture (A)  Final    STAPHYLOCOCCUS AUREUS SUSCEPTIBILITIES PERFORMED ON PREVIOUS CULTURE WITHIN THE LAST 5 DAYS. Performed at [facility], [place] [address], [place], [place] [address]    Report Status 09/19/2020 FINAL  Final  Blood culture (routine x 2)     Status: Abnormal   Collection Time: 09/16/20  8:40 PM   Specimen: BLOOD  Result Value Ref Range Status   Specimen Description   Final    BLOOD BLOOD RIGHT FOREARM Performed at [facility], [address] [place], [place] [address]    Special Requests   Final    BOTTLES DRAWN AEROBIC AND ANAEROBIC Blood Culture adequate volume Performed at [facility], [address] [place], [place] [address]    Culture  Setup Time   Final    GRAM POSITIVE COCCI IN BOTH AEROBIC AND ANAEROBIC BOTTLES CRITICAL RESULT CALLED TO, READ BACK BY AND VERIFIED WITH: AMY THOMPSON AT 7564 ON 09/17/20 SNG Performed at [facility], [address], [place], [place] [address]    Culture METHICILLIN RESISTANT STAPHYLOCOCCUS AUREUS (A)  Final   Report Status 09/19/2020 FINAL  Final   Organism ID, Bacteria  METHICILLIN RESISTANT STAPHYLOCOCCUS AUREUS  Final      Susceptibility   Methicillin resistant staphylococcus aureus - MIC*    CIPROFLOXACIN >=8 RESISTANT Resistant     ERYTHROMYCIN >=8 RESISTANT Resistant     GENTAMICIN <=0.5 SENSITIVE Sensitive     OXACILLIN >=4 RESISTANT Resistant     TETRACYCLINE <=1 SENSITIVE Sensitive     VANCOMYCIN <=0.5 SENSITIVE Sensitive     TRIMETH/SULFA >=320 RESISTANT Resistant     CLINDAMYCIN <=0.25 SENSITIVE Sensitive     RIFAMPIN <=0.5 SENSITIVE Sensitive     Inducible Clindamycin NEGATIVE Sensitive     * METHICILLIN RESISTANT STAPHYLOCOCCUS AUREUS  Blood Culture ID Panel (Reflexed)     Status: Abnormal   Collection Time: 09/16/20  8:40 PM  Result Value Ref Range Status   Enterococcus faecalis NOT DETECTED NOT DETECTED Final   Enterococcus Faecium NOT DETECTED NOT DETECTED Final   Listeria monocytogenes NOT DETECTED NOT DETECTED Final   Staphylococcus species DETECTED (A) NOT DETECTED Final    Comment: CRITICAL RESULT CALLED TO, READ BACK BY AND VERIFIED WITH: AMY THOMPSON AT 1884 ON 09/17/20 SNG    Staphylococcus aureus (BCID) DETECTED (A) NOT DETECTED Final    Comment: Methicillin (oxacillin)-resistant Staphylococcus aureus (MRSA). MRSA is predictably resistant to beta-lactam antibiotics (except ceftaroline). Preferred therapy is vancomycin unless clinically contraindicated. Patient requires contact precautions if  hospitalized. CRITICAL RESULT CALLED TO, READ BACK BY AND VERIFIED WITH: AMY THOMPSON AT 1660 ON 09/17/20 SNG    Staphylococcus epidermidis NOT DETECTED NOT DETECTED Final   Staphylococcus lugdunensis NOT DETECTED NOT DETECTED Final   Streptococcus species NOT DETECTED NOT DETECTED Final   Streptococcus agalactiae NOT DETECTED NOT DETECTED Final   Streptococcus pneumoniae NOT DETECTED NOT DETECTED Final   Streptococcus pyogenes NOT DETECTED NOT DETECTED Final   A.calcoaceticus-baumannii NOT DETECTED NOT DETECTED Final   Bacteroides  fragilis NOT DETECTED NOT DETECTED Final  Enterobacterales NOT DETECTED NOT DETECTED Final   Enterobacter cloacae complex NOT DETECTED NOT DETECTED Final   Escherichia coli NOT DETECTED NOT DETECTED Final   Klebsiella aerogenes NOT DETECTED NOT DETECTED Final   Klebsiella oxytoca NOT DETECTED NOT DETECTED Final   Klebsiella pneumoniae NOT DETECTED NOT DETECTED Final   Proteus species NOT DETECTED NOT DETECTED Final   Salmonella species NOT DETECTED NOT DETECTED Final   Serratia marcescens NOT DETECTED NOT DETECTED Final   Haemophilus influenzae NOT DETECTED NOT DETECTED Final   Neisseria meningitidis NOT DETECTED NOT DETECTED Final   Pseudomonas aeruginosa NOT DETECTED NOT DETECTED Final   Stenotrophomonas maltophilia NOT DETECTED NOT DETECTED Final   Candida albicans NOT DETECTED NOT DETECTED Final   Candida auris NOT DETECTED NOT DETECTED Final   Candida glabrata NOT DETECTED NOT DETECTED Final   Candida krusei NOT DETECTED NOT DETECTED Final   Candida parapsilosis NOT DETECTED NOT DETECTED Final   Candida tropicalis NOT DETECTED NOT DETECTED Final   Cryptococcus neoformans/gattii NOT DETECTED NOT DETECTED Final   Meth resistant mecA/C and MREJ DETECTED (A) NOT DETECTED Final    Comment: CRITICAL RESULT CALLED TO, READ BACK BY AND VERIFIED WITH: AMY THOMPSON AT 1749 ON 09/17/20 [facility] Performed at [facility], [address] [place], [place] [address]   Resp Panel by RT-PCR (Flu A&B, Covid)     Status: None   Collection Time: 09/16/20  9:58 PM  Result Value Ref Range Status   SARS Coronavirus 2 by RT PCR NEGATIVE NEGATIVE Final    Comment: (NOTE) SARS-CoV-2 target nucleic acids are NOT DETECTED.  The SARS-CoV-2 RNA is generally detectable in upper respiratory specimens during the acute phase of infection. The lowest concentration of SARS-CoV-2 viral copies this assay can detect is 138 copies/mL. A negative result does not preclude SARS-Cov-2 infection and should not be  used as the sole basis for treatment or other patient management decisions. A negative result may occur with  improper specimen collection/handling, submission of specimen other than nasopharyngeal swab, presence of viral mutation(s) within the areas targeted by this assay, and inadequate number of viral copies(<138 copies/mL). A negative result must be combined with clinical observations, patient history, and epidemiological information. The expected result is Negative.  Fact Sheet for Patients:  EntrepreneurPulse.com.au  Fact Sheet for Healthcare Providers:  IncredibleEmployment.be  This test is no t yet approved or cleared by the [place] FDA and  has been authorized for detection and/or diagnosis of SARS-CoV-2 by FDA under an Emergency Use Authorization (EUA). This EUA will remain  in effect (meaning this test can be used) for the duration of the COVID-19 declaration under Section 564(b)(1) of the Act, 21 U.S.C.section 360bbb-3(b)(1), unless the authorization is terminated  or revoked sooner.       Influenza A by PCR NEGATIVE NEGATIVE Final   Influenza B by PCR NEGATIVE NEGATIVE Final    Comment: (NOTE) The Xpert Xpress SARS-CoV-2/FLU/RSV plus assay is intended as an aid in the diagnosis of influenza from Nasopharyngeal swab specimens and should not be used as a sole basis for treatment. Nasal washings and aspirates are unacceptable for Xpert Xpress SARS-CoV-2/FLU/RSV testing.  Fact Sheet for Patients: EntrepreneurPulse.com.au  Fact Sheet for Healthcare Providers: IncredibleEmployment.be  This test is not yet approved or cleared by the [place] FDA and has been authorized for detection and/or diagnosis of SARS-CoV-2 by FDA under an Emergency Use Authorization (EUA). This EUA will remain in effect (meaning this test can be used) for the  duration of the COVID-19 declaration under Section  564(b)(1) of the Act, 21 U.S.C. section 360bbb-3(b)(1), unless the authorization is terminated or revoked.  Performed at [facility], [address] [place], [place] [address]   CULTURE, BLOOD (ROUTINE X 2) w Reflex to ID Panel     Status: None (Preliminary result)   Collection Time: 09/18/20  4:30 PM   Specimen: BLOOD  Result Value Ref Range Status   Specimen Description BLOOD BLOOD RIGHT ARM  Final   Special Requests   Final    BOTTLES DRAWN AEROBIC AND ANAEROBIC Blood Culture adequate volume   Culture   Final    NO GROWTH 3 DAYS Performed at [facility], [address] [place], [place] [address]    Report Status PENDING  Incomplete  CULTURE, BLOOD (ROUTINE X 2) w Reflex to ID Panel     Status: None (Preliminary result)   Collection Time: 09/18/20  4:32 PM   Specimen: BLOOD  Result Value Ref Range Status   Specimen Description BLOOD BLOOD RIGHT HAND  Final   Special Requests   Final    BOTTLES DRAWN AEROBIC AND ANAEROBIC Blood Culture adequate volume   Culture   Final    NO GROWTH 3 DAYS Performed at [facility], [address] [place], [place] [address]    Report Status PENDING  Incomplete         Radiology Studies: No results found.      Scheduled Meds: . Chlorhexidine Gluconate Cloth  6 each Topical Daily  . enoxaparin (LOVENOX) injection  40 mg Subcutaneous Q24H  . insulin aspart  0-9 Units Subcutaneous TID WC   Continuous Infusions: . sodium chloride 250 mL (09/19/20 2224)  . cefTRIAXone (ROCEPHIN)  IV 1 g (09/20/20 2301)  . vancomycin 500 mg (09/21/20 1125)     LOS: 4 days    Time spent: 28 minutes    Sharen Hones, MD [facility]   To contact the attending provider between 7A-7P or the covering provider during after hours 7P-7A, please log into the web site [email or web address] and access using universal [place] password for that web site. If you do not have the password, please call the hospital  operator.  09/21/2020, 12:39 PM

## 2020-09-22 ENCOUNTER — Inpatient Hospital Stay: Payer: Medicare (Managed Care)

## 2020-09-22 DIAGNOSIS — N390 Urinary tract infection, site not specified: Secondary | ICD-10-CM | POA: Diagnosis not present

## 2020-09-22 DIAGNOSIS — N1330 Unspecified hydronephrosis: Secondary | ICD-10-CM | POA: Diagnosis not present

## 2020-09-22 DIAGNOSIS — G9341 Metabolic encephalopathy: Secondary | ICD-10-CM | POA: Diagnosis not present

## 2020-09-22 DIAGNOSIS — N132 Hydronephrosis with renal and ureteral calculous obstruction: Secondary | ICD-10-CM

## 2020-09-22 DIAGNOSIS — R7881 Bacteremia: Secondary | ICD-10-CM | POA: Diagnosis not present

## 2020-09-22 DIAGNOSIS — D649 Anemia, unspecified: Secondary | ICD-10-CM | POA: Diagnosis not present

## 2020-09-22 DIAGNOSIS — B9562 Methicillin resistant Staphylococcus aureus infection as the cause of diseases classified elsewhere: Secondary | ICD-10-CM | POA: Diagnosis not present

## 2020-09-22 DIAGNOSIS — R410 Disorientation, unspecified: Secondary | ICD-10-CM | POA: Diagnosis not present

## 2020-09-22 DIAGNOSIS — A4102 Sepsis due to Methicillin resistant Staphylococcus aureus: Secondary | ICD-10-CM | POA: Diagnosis not present

## 2020-09-22 LAB — CBC WITH DIFFERENTIAL/PLATELET
Abs Immature Granulocytes: 0.19 10*3/uL — ABNORMAL HIGH (ref 0.00–0.07)
Basophils Absolute: 0 10*3/uL (ref 0.0–0.1)
Basophils Relative: 0 %
Eosinophils Absolute: 0 10*3/uL (ref 0.0–0.5)
Eosinophils Relative: 0 %
HCT: 26.1 % — ABNORMAL LOW (ref 39.0–52.0)
Hemoglobin: 7.7 g/dL — ABNORMAL LOW (ref 13.0–17.0)
Immature Granulocytes: 2 %
Lymphocytes Relative: 15 %
Lymphs Abs: 1.8 10*3/uL (ref 0.7–4.0)
MCH: 23.2 pg — ABNORMAL LOW (ref 26.0–34.0)
MCHC: 29.5 g/dL — ABNORMAL LOW (ref 30.0–36.0)
MCV: 78.6 fL — ABNORMAL LOW (ref 80.0–100.0)
Monocytes Absolute: 0.7 10*3/uL (ref 0.1–1.0)
Monocytes Relative: 6 %
Neutro Abs: 8.9 10*3/uL — ABNORMAL HIGH (ref 1.7–7.7)
Neutrophils Relative %: 77 %
Platelets: 296 10*3/uL (ref 150–400)
RBC: 3.32 MIL/uL — ABNORMAL LOW (ref 4.22–5.81)
RDW: 17 % — ABNORMAL HIGH (ref 11.5–15.5)
WBC: 11.5 10*3/uL — ABNORMAL HIGH (ref 4.0–10.5)
nRBC: 0 % (ref 0.0–0.2)

## 2020-09-22 LAB — GLUCOSE, CAPILLARY
Glucose-Capillary: 100 mg/dL — ABNORMAL HIGH (ref 70–99)
Glucose-Capillary: 94 mg/dL (ref 70–99)
Glucose-Capillary: 79 mg/dL (ref 70–99)
Glucose-Capillary: 125 mg/dL — ABNORMAL HIGH (ref 70–99)

## 2020-09-22 LAB — BASIC METABOLIC PANEL
Anion gap: 8 (ref 5–15)
BUN: 11 mg/dL (ref 8–23)
CO2: 27 mmol/L (ref 22–32)
Calcium: 7.9 mg/dL — ABNORMAL LOW (ref 8.9–10.3)
Chloride: 103 mmol/L (ref 98–111)
Creatinine, Ser: 0.74 mg/dL (ref 0.61–1.24)
GFR, Estimated: >60 mL/min (ref >60)
Glucose, Bld: 109 mg/dL — ABNORMAL HIGH (ref 70–99)
Potassium: 3.3 mmol/L — ABNORMAL LOW (ref 3.5–5.1)
Sodium: 138 mmol/L (ref 135–145)

## 2020-09-22 LAB — MAGNESIUM: Magnesium: 2 mg/dL (ref 1.7–2.4)

## 2020-09-22 MED ORDER — SODIUM CHLORIDE 0.9 % IV SOLN: 300.0000 mg | Freq: Once | INTRAVENOUS | Status: DC

## 2020-09-22 MED ORDER — POTASSIUM CHLORIDE 10 MEQ/100ML IV SOLN
10.0000 meq | INTRAVENOUS | Status: DC
Start: 2020-09-22 — End: 2020-09-22
  Administered 2020-09-22: 10 meq via INTRAVENOUS
  Filled 2020-09-22: qty 100

## 2020-09-22 MED ORDER — POTASSIUM CHLORIDE 20 MEQ PO PACK
40.0000 meq | PACK | Freq: Once | ORAL | Status: AC
  Administered 2020-09-22: 40 meq via ORAL
  Filled 2020-09-22: qty 2

## 2020-09-22 NOTE — TOC Progression Note (Signed)
Transition of Care [facility]) - Progression Note    Patient Details  Name: Rick Marquez MRN: [id number] Date of Birth: 07/30/1940  [facility] CM/SW [place], LCSW Phone Number: 09/22/2020, 11:15 AM  Clinical Narrative:   CSW left VM for [place] [facility] requesting a return call with an update regarding patient.         Expected Discharge Plan and Services                                                 Social Determinants of Health (SDOH) Interventions    Readmission Risk Interventions No flowsheet data found.

## 2020-09-22 NOTE — Consult Note (Signed)
[facility] Consult   Reason for Consult: Repeat consult for this 80 year old man this time with specific concern about consent for transesophageal echocardiogram Referring Physician:  Ro[place] Patient Identification: Rick Marquez MRN:  [id number] Principal Diagnosis: Subacute delirium Diagnosis:  Principal Problem:   Subacute delirium Active Problems:   Sepsis ([place])   Acute lower UTI   Anemia   Acute metabolic encephalopathy   Urinary retention   MRSA (methicillin resistant Staphylococcus aureus) septicemia ([place])   Total Time spent with patient: 30 minutes  Subjective:   Rick Marquez is a 80 y.o. male patient admitted with "I am feeling much better".  HPI: Patient seen for follow-up.  Chart reviewed.  80 year old man who was found confused and a long way from home.  Diagnosed with sepsis.  Initially very delirious.  Now recovering.  Transesophageal echocardiogram has been recommended to evaluate possible cardiac infection.  On interview the patient was initially asleep but easy to awake.  He was attentive and engaged throughout the conversation.  He knew he was in the hospital but could not tell me what state he was in.  I told him that he was in [place] and asked him to remember it.  He actually was able to remember that all the way to the end of the conversation.  Patient still confused about his general medical condition.  He also remains very focused on insisting that he lives in [place].  I did not argue with him but after reviewing the chart it seems pretty clear that he is currently residing in [place] although he probably until recently had been living in [place] since his wife is still there recovering from [place].  I explained to the patient about his septic infection.  I explained to him about echocardiograms and the reason for doing a transesophageal echocardiogram specifically to visualize parts of the heart on the "backside" that could not be  adequately seen with a standard echocardiogram.  I explained that this could be relevant to his infection and determine the safe treatment recommendations for his infection.  Patient was able to ask reasonable questions about it.  After I described it he was able to repeat to me in his own words what I was talking about and the reason for doing it.  I told him, based on the cardiology note, that there was a 1 in 10,000 chance of a perforation of the esophagus that could require surgery and that there were also small chances of other problematic outcomes but that by enlarge the procedure was usually quite safe.  I reassured him that because of conscious sedation he would probably have no memory of the procedure.  Patient stated that he wanted to have this test or any appropriate test done "as long as it would not hurt me".  His biggest concern was whether his prescription medicines could be sent to [facility].  Past Psychiatric History: Unknown.  We are getting some conflicting reports about whether or not he has any cognitive impairment at baseline.  Sounds like he probably does have some although we do not have specific measure of it so it is difficult to compare it to his current condition  Risk to Self:   Risk to Others:   Prior Inpatient Therapy:   Prior Outpatient Therapy:    Past Medical History: History reviewed. No pertinent past medical history. History reviewed. No pertinent surgical history. Family History: No family history on file. Family Psychiatric  History: None known  Social History:  Social History   Substance and Sexual Activity  Alcohol Use None     Social History   Substance and Sexual Activity  Drug Use Not on file    Social History   Socioeconomic History  . Marital status: Married    Spouse name: Not on file  . Number of children: Not on file  . Years of education: Not on file  . Highest education level: Not on file  Occupational History  . Not on  file  Tobacco Use  . Smoking status: Not on file  . Smokeless tobacco: Not on file  Substance and Sexual Activity  . Alcohol use: Not on file  . Drug use: Not on file  . Sexual activity: Not on file  Other Topics Concern  . Not on file  Social History Narrative  . Not on file   Social Determinants of Health   Financial Resource Strain: Not on file  Food Insecurity: Not on file  Transportation Needs: Not on file  Physical Activity: Not on file  Stress: Not on file  Social Connections: Not on file   Additional Social History:    Allergies:  Not on File  Labs:  Results for orders placed or performed during the hospital encounter of 09/16/20 (from the past 48 hour(s))  Glucose, capillary     Status: Abnormal   Collection Time: 09/20/20 12:09 PM  Result Value Ref Range   Glucose-Capillary 132 (H) 70 - 99 mg/dL    Comment: Glucose reference range applies only to samples taken after fasting for at least 8 hours.   Comment 1 Notify RN    Comment 2 Document in Chart   Glucose, capillary     Status: Abnormal   Collection Time: 09/20/20  4:37 PM  Result Value Ref Range   Glucose-Capillary 141 (H) 70 - 99 mg/dL    Comment: Glucose reference range applies only to samples taken after fasting for at least 8 hours.   Comment 1 Notify RN    Comment 2 Document in Chart   Glucose, capillary     Status: Abnormal   Collection Time: 09/20/20  9:03 PM  Result Value Ref Range   Glucose-Capillary 123 (H) 70 - 99 mg/dL    Comment: Glucose reference range applies only to samples taken after fasting for at least 8 hours.   Comment 1 Notify RN   Creatinine, serum     Status: None   Collection Time: 09/21/20  4:59 AM  Result Value Ref Range   Creatinine, Ser 0.71 0.61 - 1.24 mg/dL   GFR, Estimated >60 >60 mL/min    Comment: (NOTE) Calculated using the CKD-EPI Creatinine Equation (2021) Performed at [facility], [address] [place], [place] [address]   Glucose, capillary      Status: Abnormal   Collection Time: 09/21/20  7:50 AM  Result Value Ref Range   Glucose-Capillary 125 (H) 70 - 99 mg/dL    Comment: Glucose reference range applies only to samples taken after fasting for at least 8 hours.  Iron and TIBC     Status: Abnormal   Collection Time: 09/21/20  8:01 AM  Result Value Ref Range   Iron 12 (L) 45 - 182 ug/dL   TIBC 113 (L) 250 - 450 ug/dL   Saturation Ratios 11 (L) 17.9 - 39.5 %   UIBC 101 ug/dL    Comment: Performed at [facility], [address], [place], [place] [address]  Vitamin B12  Status: None   Collection Time: 09/21/20  8:01 AM  Result Value Ref Range   Vitamin B-12 706 180 - 914 pg/mL    Comment: (NOTE) This assay is not validated for testing neonatal or myeloproliferative syndrome specimens for Vitamin B12 levels. Performed at [facility], [address], [place], [place] [address]   Glucose, capillary     Status: None   Collection Time: 09/21/20 11:31 AM  Result Value Ref Range   Glucose-Capillary 90 70 - 99 mg/dL    Comment: Glucose reference range applies only to samples taken after fasting for at least 8 hours.  Glucose, capillary     Status: Abnormal   Collection Time: 09/21/20  4:32 PM  Result Value Ref Range   Glucose-Capillary 157 (H) 70 - 99 mg/dL    Comment: Glucose reference range applies only to samples taken after fasting for at least 8 hours.  Glucose, capillary     Status: Abnormal   Collection Time: 09/21/20 10:06 PM  Result Value Ref Range   Glucose-Capillary 110 (H) 70 - 99 mg/dL    Comment: Glucose reference range applies only to samples taken after fasting for at least 8 hours.  CBC with Differential/Platelet     Status: Abnormal   Collection Time: 09/22/20  7:01 AM  Result Value Ref Range   WBC 11.5 (H) 4.0 - 10.5 K/uL   RBC 3.32 (L) 4.22 - 5.81 MIL/uL   Hemoglobin 7.7 (L) 13.0 - 17.0 g/dL   HCT 26.1 (L) 39.0 - 52.0 %   MCV 78.6 (L) 80.0 - 100.0 fL   MCH 23.2 (L) 26.0 - 34.0 pg    MCHC 29.5 (L) 30.0 - 36.0 g/dL   RDW 17.0 (H) 11.5 - 15.5 %   Platelets 296 150 - 400 K/uL   nRBC 0.0 0.0 - 0.2 %   Neutrophils Relative % 77 %   Neutro Abs 8.9 (H) 1.7 - 7.7 K/uL   Lymphocytes Relative 15 %   Lymphs Abs 1.8 0.7 - 4.0 K/uL   Monocytes Relative 6 %   Monocytes Absolute 0.7 0.1 - 1.0 K/uL   Eosinophils Relative 0 %   Eosinophils Absolute 0.0 0.0 - 0.5 K/uL   Basophils Relative 0 %   Basophils Absolute 0.0 0.0 - 0.1 K/uL   Immature Granulocytes 2 %   Abs Immature Granulocytes 0.19 (H) 0.00 - 0.07 K/uL    Comment: Performed at [facility], [address] [place], [place] [address]  Basic metabolic panel     Status: Abnormal   Collection Time: 09/22/20  7:01 AM  Result Value Ref Range   Sodium 138 135 - 145 mmol/L   Potassium 3.3 (L) 3.5 - 5.1 mmol/L   Chloride 103 98 - 111 mmol/L   CO2 27 22 - 32 mmol/L   Glucose, Bld 109 (H) 70 - 99 mg/dL    Comment: Glucose reference range applies only to samples taken after fasting for at least 8 hours.   BUN 11 8 - 23 mg/dL   Creatinine, Ser 0.74 0.61 - 1.24 mg/dL   Calcium 7.9 (L) 8.9 - 10.3 mg/dL   GFR, Estimated >60 >60 mL/min    Comment: (NOTE) Calculated using the CKD-EPI Creatinine Equation (2021)    Anion gap 8 5 - 15    Comment: Performed at [facility], [address] [place], [place] [address]  Magnesium     Status: None   Collection Time: 09/22/20  7:01 AM  Result Value Ref Range   Magnesium 2.0 1.7 - 2.4 mg/dL    Comment: Performed at [facility], [address] [place], [place] [address]  Glucose, capillary     Status: Abnormal   Collection Time: 09/22/20  9:07 AM  Result Value Ref Range   Glucose-Capillary 100 (H) 70 - 99 mg/dL    Comment: Glucose reference range applies only to samples taken after fasting for at least 8 hours.    Current Facility-Administered Medications  Medication Dose Route Frequency Provider Last Rate Last Admin  . 0.9 %  sodium chloride  infusion   Intravenous PRN Swayze, Ava, DO 10 mL/hr at 09/21/20 2340 250 mL at 09/21/20 2340  . acetaminophen (TYLENOL) tablet 650 mg  650 mg Oral Q6H PRN Lenore Cordia, MD   650 mg at 09/22/20 0243   Or  . acetaminophen (TYLENOL) suppository 650 mg  650 mg Rectal Q6H PRN Lenore Cordia, MD      . Chlorhexidine Gluconate Cloth 2 % PADS 6 each  6 each Topical Daily Swayze, Ava, DO   6 each at 09/22/20 1021  . enoxaparin (LOVENOX) injection 40 mg  40 mg Subcutaneous Q24H Zada Finders R, MD   40 mg at 09/22/20 1021  . feeding supplement (ENSURE ENLIVE / ENSURE PLUS) liquid 237 mL  237 mL Oral BID BM Sharen Hones, MD   237 mL at 09/21/20 1438  . insulin aspart (novoLOG) injection 0-9 Units  0-9 Units Subcutaneous TID WC Lenore Cordia, MD   2 Units at 09/21/20 1751  . ondansetron (ZOFRAN) tablet 4 mg  4 mg Oral Q6H PRN Lenore Cordia, MD       Or  . ondansetron (ZOFRAN) injection 4 mg  4 mg Intravenous Q6H PRN Zada Finders R, MD      . vancomycin (VANCOREADY) IVPB 500 mg/100 mL  500 mg Intravenous Q12H Swayze, Ava, DO 100 mL/hr at 09/21/20 2342 500 mg at 09/21/20 2342    Musculoskeletal: Strength & Muscle Tone: decreased Gait & Station: unsteady Patient leans: N/A  Psychiatric Specialty Exam: Physical Exam Vitals and nursing note reviewed.  Constitutional:      Appearance: He is well-developed and well-nourished.  HENT:     Head: Normocephalic and atraumatic.  Eyes:     Conjunctiva/sclera: Conjunctivae normal.     Pupils: Pupils are equal, round, and reactive to light.  Cardiovascular:     Heart sounds: Normal heart sounds.  Pulmonary:     Effort: Pulmonary effort is normal.  Abdominal:     Palpations: Abdomen is soft.  Musculoskeletal:        General: Normal range of motion.     Cervical back: Normal range of motion.  Skin:    General: Skin is warm and dry.  Neurological:     General: No focal deficit present.     Mental Status: He is alert.  Psychiatric:         Attention and Perception: Attention normal.        Mood and Affect: Mood normal.        Speech: Speech is delayed.        Behavior: Behavior is slowed.        Thought Content: Thought content is not paranoid or delusional. Thought content does not include homicidal or suicidal ideation.        Cognition and Memory: Cognition is impaired. Memory is impaired.        Judgment: Judgment normal.  Review of Systems  Constitutional: Negative.   HENT: Negative.   Eyes: Negative.   Respiratory: Negative.   Cardiovascular: Negative.   Gastrointestinal: Negative.   Musculoskeletal: Negative.   Skin: Negative.   Neurological: Negative.   Psychiatric/Behavioral: Negative.     Blood pressure 122/67, pulse 79, temperature 97.9 F (36.6 C), temperature source Oral, resp. rate 15, height 5\' 11"  (1.803 m), weight 68 kg, SpO2 100 %.Body mass index is 20.92 kg/m.  General Appearance: Casual  Eye Contact:  Good  Speech:  Clear and Coherent  Volume:  Normal  Mood:  Euthymic  Affect:  Congruent  Thought Process:  Coherent  Orientation:  Other:  Oriented to being in the hospital.  Short-term memory still impaired.  Knows he is away from his home but still has trouble remembering details.  Thought Content:  Logical  Suicidal Thoughts:  No  Homicidal Thoughts:  No  Memory:  Immediate;   Fair Recent;   Poor Remote;   Poor  Judgement:  Fair  Insight:  Fair  Psychomotor Activity:  Decreased  Concentration:  Concentration: Fair  Recall:  [employer] of Knowledge:  Fair  Language:  Fair  Akathisia:  No  Handed:  Right  AIMS (if indicated):     Assets:  Desire for Improvement Resilience  ADL's:  Impaired  Cognition:  Impaired,  Mild  Sleep:        Treatment Plan Summary: Plan In terms of the capacity to decide about transesophageal echocardiogram I think the patient currently does have the capacity to make that decision.  His short-term memory is such that the whole procedure will need to  be described to him again when he actually signed his consent for it but I think that my conversation convinces me he has adequate understanding to make a decision.  The social situation for this patient still appears to be odd and confusing.  We still do not know why there was a woman who apparently is no relation to him who was trying to pick him up early on.  I see 1 note in which someone documented speaking with his wife on the telephone but other notes in which they could not reach her.  She has been described as having dementia but how serious that is or whether it is correct I have no idea.  When it comes to her actual discharge I am sure the patient is going to once again be disagreeable about going to [place] rather than back to [place].  For now however I think it would be safe to allow him to decide about the echocardiogram.  Disposition: No evidence of imminent risk to self or others at present.   Patient does not meet criteria for psychiatric inpatient admission. Supportive therapy provided about ongoing stressors. Discussed crisis plan, support from social network, calling 911, coming to the Emergency Department, and calling Suicide Hotline.  Alethia Berthold, MD 09/22/2020 12:07 PM

## 2020-09-22 NOTE — Progress Notes (Signed)
This nurse received a phone call from a Mr. Rick Marquez. Mr. Rick Marquez inquired status on pt. This nurse did not disclose information due to [place]. Mr. Rick Marquez stated that he became acquainted with patient during his travel from [place]. Mr. Rick Marquez expressed to have met patient in [place] when patient lost his way during the travel. Mr. Rick Marquez claimed to helped care for patient during the period they met each other. Mr. Rick Marquez also provided his contact information and stated if the patient needs help with transportation from hospital to [place] (pt's wife location), he would be able to do so. Telephone # [phone]. This nurse also asked pt regarding Mr.  Rick Marquez. Pt stated "he's a dear friend of mine. I do not want my information being shared until I get d/ced from hospital. He will transport me. That was our deal."

## 2020-09-22 NOTE — Progress Notes (Signed)
Urology Inpatient Progress Note  Subjective: Patient underwent MR lumbar spine with and without contrast yesterday with findings of moderate to severe left hydroureteronephrosis.  No historical imaging available for comparison.  Creatinine stable today, 0.74.  Foley catheter in place draining clear, yellow urine.  Patient reports low back discomfort today associated with a "knot" that has appeared on his left low back. On physical exam, the knot he is referring to is his left iliac crest.  It is tender to palpation with no overlying tissue discoloration, blanching, or eschar.  He has no CVA tenderness.  Anti-infectives: Anti-infectives (From admission, onward)   Start     Dose/Rate Route Frequency Ordered Stop   09/20/20 1100  vancomycin (VANCOREADY) IVPB 500 mg/100 mL        500 mg 100 mL/hr over 60 Minutes Intravenous Every 12 hours 09/20/20 0209     09/17/20 2200  vancomycin (VANCOREADY) IVPB 750 mg/150 mL  Status:  Discontinued       "Followed by" Linked Group Details   750 mg 150 mL/hr over 60 Minutes Intravenous Every 12 hours 09/17/20 0955 09/20/20 0208   09/17/20 1030  vancomycin (VANCOREADY) IVPB 1500 mg/300 mL       "Followed by" Linked Group Details   1,500 mg 150 mL/hr over 120 Minutes Intravenous STAT 09/17/20 0955 09/17/20 1145   09/16/20 2045  cefTRIAXone (ROCEPHIN) 1 g in sodium chloride 0.9 % 100 mL IVPB  Status:  Discontinued        1 g 200 mL/hr over 30 Minutes Intravenous Every 24 hours 09/16/20 2033 09/21/20 1516      Current Facility-Administered Medications  Medication Dose Route Frequency Provider Last Rate Last Admin  . 0.9 %  sodium chloride infusion   Intravenous PRN Swayze, Ava, DO 10 mL/hr at 09/21/20 2340 250 mL at 09/21/20 2340  . acetaminophen (TYLENOL) tablet 650 mg  650 mg Oral Q6H PRN Lenore Cordia, MD   650 mg at 09/22/20 0243   Or  . acetaminophen (TYLENOL) suppository 650 mg  650 mg Rectal Q6H PRN Lenore Cordia, MD      . Chlorhexidine  Gluconate Cloth 2 % PADS 6 each  6 each Topical Daily Swayze, Ava, DO   6 each at 09/22/20 1021  . enoxaparin (LOVENOX) injection 40 mg  40 mg Subcutaneous Q24H Zada Finders R, MD   40 mg at 09/22/20 1021  . feeding supplement (ENSURE ENLIVE / ENSURE PLUS) liquid 237 mL  237 mL Oral BID BM Sharen Hones, MD   237 mL at 09/21/20 1438  . insulin aspart (novoLOG) injection 0-9 Units  0-9 Units Subcutaneous TID WC Lenore Cordia, MD   2 Units at 09/21/20 1751  . ondansetron (ZOFRAN) tablet 4 mg  4 mg Oral Q6H PRN Lenore Cordia, MD       Or  . ondansetron (ZOFRAN) injection 4 mg  4 mg Intravenous Q6H PRN Zada Finders R, MD      . potassium chloride 10 mEq in 100 mL IVPB  10 mEq Intravenous Q1 Hr x 2 Sharen Hones, MD      . vancomycin (VANCOREADY) IVPB 500 mg/100 mL  500 mg Intravenous Q12H Swayze, Ava, DO 100 mL/hr at 09/21/20 2342 500 mg at 09/21/20 2342   Objective: Vital signs in last 24 hours: Temp:  [97.4 F (36.3 C)-98.6 F (37 C)] 97.4 F (36.3 C) (03/08 1207) Pulse Rate:  [69-84] 69 (03/08 1207) Resp:  [15-18] 16 (03/08 1207) BP: (122-131)/(63-69)  131/69 (03/08 1207) SpO2:  [96 %-100 %] 96 % (03/08 1207)  Intake/Output from previous day: 03/07 0701 - 03/08 0700 In: 450.9 [IV Piggyback:450.9] Out: 2775 [Urine:2775] Intake/Output this shift: No intake/output data recorded.  Physical Exam Vitals and nursing note reviewed.  Constitutional:      General: He is not in acute distress.    Appearance: He is not ill-appearing, toxic-appearing or diaphoretic.  HENT:     Head: Normocephalic and atraumatic.  Pulmonary:     Effort: Pulmonary effort is normal. No respiratory distress.  Abdominal:     Comments: See HPI  Skin:    General: Skin is warm and dry.  Neurological:     Mental Status: He is alert.     Comments: Perseverant on getting out of bed, however redirectable    Lab Results:  Recent Labs    09/20/20 0445 09/22/20 0701  WBC 9.3 11.5*  HGB 7.9* 7.7*  HCT 26.7*  26.1*  PLT 273 296   BMET Recent Labs    09/20/20 0445 09/21/20 0459 09/22/20 0701  NA 137  --  138  K 3.5  --  3.3*  CL 102  --  103  CO2 26  --  27  GLUCOSE 109*  --  109*  BUN 14  --  11  CREATININE 0.72 0.71 0.74  CALCIUM 7.9*  --  7.9*   Studies/Results: MR Lumbar Spine W Wo Contrast  Result Date: 09/21/2020 CLINICAL DATA:  Low back pain. Bacteremia. History of prostate cancer. EXAM: MRI LUMBAR SPINE WITHOUT AND WITH CONTRAST TECHNIQUE: Multiplanar and multiecho pulse sequences of the lumbar spine were obtained without and with intravenous contrast. CONTRAST:  76mL GADAVIST GADOBUTROL 1 MMOL/ML IV SOLN COMPARISON:  None. FINDINGS: Segmentation: Normal lumbar segmentation is assumed with the lowest fully formed disc space designated L5-S1. Alignment:  Mild lumbar levoscoliosis.  No significant listhesis. Vertebrae: No acute fracture, suspicious osseous lesion, significant marrow edema, or evidence of discitis. 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 Conus medullaris and cauda equina: Conus extends to the L1-2 level. Conus and cauda equina appear normal. Paraspinal and other soft tissues: Moderate to severe left hydroureteronephrosis, incompletely imaged including absent imaging of the distal left ureter. Partially imaged renal cysts including an approximately 7 cm cyst on the right. Disc levels: Disc desiccation from L2-3 to L4-5.  Preserved disc space heights. L1-2: Negative. L2-3: Left eccentric disc bulging and mild facet hypertrophy without stenosis. L3-4: Disc bulging and mild facet hypertrophy result in mild right greater than left lateral recess stenosis and mild right neural foraminal stenosis without spinal stenosis. L4-5: Disc bulging and moderate right and mild left facet hypertrophy result in borderline bilateral lateral recess stenosis and mild right neural foraminal stenosis without spinal stenosis. L5-S1: Mild to moderate right and severe left facet hypertrophy and minimal disc bulging  without stenosis. IMPRESSION: 1. 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 2. Mild lateral recess and right neural foraminal stenosis at L3-4 and L4-5. 3. Moderate to severe left hydroureteronephrosis, incompletely evaluated. Electronically Signed   By: Logan Bores M.D.   On: 09/21/2020 19:01   Assessment & Plan: 80 year old male with PMH prostate cancer and chronic indwelling Foley catheter with imaging findings of left hydroureteronephrosis of unclear duration.  He has no acute flank pain or CVA tenderness and creatinine is stable, consistent with chronic presentation.  No indication for urgent intervention at this time.  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  Debroah Loop, PA-C 09/22/2020

## 2020-09-22 NOTE — Progress Notes (Signed)
Date of Admission:  09/16/2020     ID: RAMELLO CORDIAL is a 80 y.o. male  Principal Problem:   Subacute delirium Active Problems:   Sepsis ([place])   Acute lower UTI   Anemia   Acute metabolic encephalopathy   Urinary retention   MRSA (methicillin resistant Staphylococcus aureus) septicemia (HCC)    Subjective: Patient is doing okay.  No fever Is not able to remember his address Does not younger his stepdaughter's names. Medications:  . Chlorhexidine Gluconate Cloth  6 each Topical Daily  . enoxaparin (LOVENOX) injection  40 mg Subcutaneous Q24H  . feeding supplement  237 mL Oral BID BM  . insulin aspart  0-9 Units Subcutaneous TID WC    Objective: Vital signs in last 24 hours: Temp:  [97.4 F (36.3 C)-98.6 F (37 C)] 97.4 F (36.3 C) (03/08 1207) Pulse Rate:  [69-84] 69 (03/08 1207) Resp:  [15-18] 16 (03/08 1207) BP: (122-131)/(63-69) 131/69 (03/08 1207) SpO2:  [96 %-100 %] 96 % (03/08 1207)  PHYSICAL EXAM:  General: Alert, cooperative, no distress, Lungs: Bilateral air entry Heart: S1-S2 Abdomen: Soft, Foley catheter Extremities: atraumatic, no cyanosis. No edema. No clubbing Skin: No rashes or lesions. Or bruising Lymph: Cervical, supraclavicular normal. Neurologic: Grossly non-focal  Lab Results Recent Labs    09/20/20 0445 09/21/20 0459 09/22/20 0701  WBC 9.3  --  11.5*  HGB 7.9*  --  7.7*  HCT 26.7*  --  26.1*  NA 137  --  138  K 3.5  --  3.3*  CL 102  --  103  CO2 26  --  27  BUN 14  --  11  CREATININE 0.72 0.71 0.74   Liver Panel Recent Labs    09/20/20 0445  PROT 6.4*  ALBUMIN 2.0*  AST 23  ALT 13  ALKPHOS 169*  BILITOT 0.6   Sedimentation Rate No results for input(s): ESRSEDRATE in the last 72 hours. C-Reactive Protein No results for input(s): CRP in the last 72 hours.  Microbiology:  Studies/Results: MR Lumbar Spine W Wo Contrast  Result Date: 09/21/2020 CLINICAL DATA:  Low back pain. Bacteremia. History of prostate cancer.  EXAM: MRI LUMBAR SPINE WITHOUT AND WITH CONTRAST TECHNIQUE: Multiplanar and multiecho pulse sequences of the lumbar spine were obtained without and with intravenous contrast. CONTRAST:  29mL GADAVIST GADOBUTROL 1 MMOL/ML IV SOLN COMPARISON:  None. FINDINGS: Segmentation: Normal lumbar segmentation is assumed with the lowest fully formed disc space designated L5-S1. Alignment:  Mild lumbar levoscoliosis.  No significant listhesis. Vertebrae: No acute fracture, suspicious osseous lesion, significant marrow edema, or evidence of discitis. 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 Conus medullaris and cauda equina: Conus extends to the L1-2 level. Conus and cauda equina appear normal. Paraspinal and other soft tissues: Moderate to severe left hydroureteronephrosis, incompletely imaged including absent imaging of the distal left ureter. Partially imaged renal cysts including an approximately 7 cm cyst on the right. Disc levels: Disc desiccation from L2-3 to L4-5.  Preserved disc space heights. L1-2: Negative. L2-3: Left eccentric disc bulging and mild facet hypertrophy without stenosis. L3-4: Disc bulging and mild facet hypertrophy result in mild right greater than left lateral recess stenosis and mild right neural foraminal stenosis without spinal stenosis. L4-5: Disc bulging and moderate right and mild left facet hypertrophy result in borderline bilateral lateral recess stenosis and mild right neural foraminal stenosis without spinal stenosis. L5-S1: Mild to moderate right and severe left facet hypertrophy and minimal disc bulging without stenosis. IMPRESSION: 1.  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 2. Mild lateral recess and right neural foraminal stenosis at L3-4 and L4-5. 3. Moderate to severe left hydroureteronephrosis, incompletely evaluated. Electronically Signed   By: Logan Bores M.D.   On: 09/21/2020 19:01      Assessment/Plan: Acute metabolic encephalopathy which is resolved He he does have some memory lapses  MRSA bacteremia: Unclear source: Could be from chronic Foley catheter use.  MRI of the lumbar spine no infection.  CT of the cervical spine did not show any evidence of discitis or osteomyelitis.  TEE is pending. 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  There are trying to get records from his outside urologist.  Anemia status post blood transfusion  Discussed the management with the patient and care team.

## 2020-09-22 NOTE — Progress Notes (Signed)
PROGRESS NOTE    Rick Marquez  UUE:[id number] DOB: 02/18/1941 DOA: 09/16/2020 PCP: Patient, No Pcp Per   Chief complaint.  Altered mental status. Brief Narrative:  Rick Marquez a 80 y.o.malewithunknown medical history who presents to the ED for evaluation of confusion. History is limited from patient due to confusion.  In the emergency room, he met sepsis criteria with tachycardia, tachypnea and leukocytosis.  His blood culture was positive for MRSA.  He is covered with vancomycin. Patient has some lower back pain, MRI of the lumbar spine did not show evidence of discitis.  Assessment & Plan:   Principal Problem:   Subacute delirium Active Problems:   Sepsis ([place])   Acute lower UTI   Anemia   Acute metabolic encephalopathy   Urinary retention   MRSA (methicillin resistant Staphylococcus aureus) septicemia ([place])   #1.  Sepsis with MRSA septicemia. Acute metabolic encephalopathy. Etiology unclear for his bacteremia.  Lumbar spine MRI did not show any evidence of infection.  Discussed with ID, will obtain TEE. Patient has been seen by psychiatry, he is deemed to be able to make his own decision about TEE. Continue vancomycin for Repeated blood cultures so far no growth.  #2.  Iron deficient anemia. No active bleeding, patient received IV iron, B12 level normal.  3.  Protein calorie malnutrition. Continue supplement  4.  UTI. EDP  5.  Hypokalemia. Hyponatremia Supplement potassium through IV  6.  Left hydronephrosis. Seen on MR the spine, will obtain ultrasound.  Notified Dr. Bernardo Heater.      DVT prophylaxis: Lovenox Code Status: Full Family Communication:  Disposition Plan:  .   Status is: Inpatient  Remains inpatient appropriate because:Inpatient level of care appropriate due to severity of illness   Dispo: The patient is from: Home              Anticipated d/c is to: Home              Patient currently is not medically stable to d/c.   Difficult  to place patient No        I/O last 3 completed shifts: In: 450.9 [IV Piggyback:450.9] Out: 3075 [Urine:3075] No intake/output data recorded.     Consultants:   ID, cardiology and urology  Procedures: None  Antimicrobials: None  Subjective: Patient doing well today, he does not seem to have any confusion. He denies any abdominal pain or nausea vomiting. No dysuria or hematuria. No fever or chills. He denies any short of breath or cough  Objective: Vitals:   09/21/20 1608 09/21/20 1933 09/22/20 0905 09/22/20 1207  BP: 123/63 129/69 122/67 131/69  Pulse: 84 74 79 69  Resp: _0 Temp: 98.6 F (37 C)  97.9 F (36.6 C) (!) 97.4 F (36.3 C)  TempSrc:   Oral Oral  SpO2: 99% 99% 100% 96%  Weight:      Height:        Intake/Output Summary (Last 24 hours) at 09/22/2020 1310 Last data filed at 09/22/2020 0700 Gross per 24 hour  Intake 450.9 ml  Output 2775 ml  Net -2324.1 ml   Filed Weights   09/16/20 1940  Weight: 68 kg    Examination:  General exam: Appears calm and comfortable  Respiratory system: Clear to auscultation. Respiratory effort normal. Cardiovascular system: S1 & S2 heard, RRR. No JVD, murmurs, rubs, gallops or clicks. No pedal edema. Gastrointestinal system: Abdomen is nondistended, soft and nontender. No organomegaly or masses felt. Normal bowel  sounds heard. Central nervous system: Alert and oriented x3. No focal neurological deficits. Extremities: Symmetric 5 x 5 power. Skin: No rashes, lesions or ulcers Psychiatry: Judgement and insight appear normal. Mood & affect appropriate.     Data Reviewed: I have personally reviewed following labs and imaging studies  CBC: Recent Labs  Lab 09/16/20 1947 09/17/20 0336 09/20/20 0445 09/22/20 0701  WBC 14.6* 14.8* 9.3 11.5*  NEUTROABS  --   --  7.0 8.9*  HGB 8.8* 8.5* 7.9* 7.7*  HCT 29.3* 28.6* 26.7* 26.1*  MCV 78.1* 78.8* 79.0* 78.6*  PLT 353 249 273 588   Basic Metabolic  Panel: Recent Labs  Lab 09/16/20 1947 09/17/20 0336 09/18/20 0442 09/19/20 0426 09/20/20 0445 09/21/20 0459 09/22/20 0701  NA 132* 133*  --   --  137  --  138  K 3.5 3.6  --   --  3.5  --  3.3*  CL 99 102  --   --  102  --  103  CO2 23 22  --   --  26  --  27  GLUCOSE 170* 150*  --   --  109*  --  109*  BUN 20 17  --   --  14  --  11  CREATININE 1.03 0.92 0.83 0.76 0.72 0.71 0.74  CALCIUM 8.2* 7.8*  --   --  7.9*  --  7.9*  MG  --   --   --   --   --   --  2.0   GFR: Estimated Creatinine Clearance: 73.2 mL/min (by C-G formula based on SCr of 0.74 mg/dL). Liver Function Tests: Recent Labs  Lab 09/16/20 1947 09/20/20 0445  AST 21 23  ALT 13 13  ALKPHOS 108 169*  BILITOT 0.6 0.6  PROT 8.1 6.4*  ALBUMIN 2.7* 2.0*   No results for input(s): LIPASE, AMYLASE in the last 168 hours. No results for input(s): AMMONIA in the last 168 hours. Coagulation Profile: No results for input(s): INR, PROTIME in the last 168 hours. Cardiac Enzymes: No results for input(s): CKTOTAL, CKMB, CKMBINDEX, TROPONINI in the last 168 hours. BNP (last 3 results) No results for input(s): PROBNP in the last 8760 hours. HbA1C: No results for input(s): HGBA1C in the last 72 hours. CBG: Recent Labs  Lab 09/21/20 1131 09/21/20 1632 09/21/20 2206 09/22/20 0907 09/22/20 1207  GLUCAP 90 157* 110* 100* 94   Lipid Profile: No results for input(s): CHOL, HDL, LDLCALC, TRIG, CHOLHDL, LDLDIRECT in the last 72 hours. Thyroid Function Tests: No results for input(s): TSH, T4TOTAL, FREET4, T3FREE, THYROIDAB in the last 72 hours. Anemia Panel: Recent Labs    09/21/20 0801  VITAMINB12 706  TIBC 113*  IRON 12*   Sepsis Labs: Recent Labs  Lab 09/16/20 1947 09/17/20 0336  PROCALCITON  --  0.92  LATICACIDVEN 1.8  --     Recent Results (from the past 240 hour(s))  Urine culture     Status: Abnormal   Collection Time: 09/16/20  7:47 PM   Specimen: Urine, Random  Result Value Ref Range Status    Specimen Description   Final    URINE, RANDOM Performed at [facility], [address], [place], [place] [address]    Special Requests   Final    NONE Performed at [facility], [address] [place], [place] [address]    Culture MULTIPLE SPECIES PRESENT, SUGGEST RECOLLECTION (A)  Final   Report Status 09/18/2020 FINAL  Final  Blood  culture (routine x 2)     Status: Abnormal   Collection Time: 09/16/20  7:47 PM   Specimen: BLOOD  Result Value Ref Range Status   Specimen Description   Final    BLOOD RIGHT ANTECUBITAL Performed at [facility], [address] [place], [place] [address]    Special Requests   Final    BOTTLES DRAWN AEROBIC AND ANAEROBIC Blood Culture adequate volume Performed at [facility], [address] [place], [place] [address]    Culture  Setup Time   Final    GRAM POSITIVE COCCI IN BOTH AEROBIC AND ANAEROBIC BOTTLES CRITICAL VALUE NOTED.  VALUE IS CONSISTENT WITH PREVIOUSLY REPORTED AND CALLED VALUE. Performed at [facility], [address] [place], [place] [address]    Culture (A)  Final    STAPHYLOCOCCUS AUREUS SUSCEPTIBILITIES PERFORMED ON PREVIOUS CULTURE WITHIN THE LAST 5 DAYS. Performed at [facility], [place] [address], [place], [place] [address]    Report Status 09/19/2020 FINAL  Final  Blood culture (routine x 2)     Status: Abnormal   Collection Time: 09/16/20  8:40 PM   Specimen: BLOOD  Result Value Ref Range Status   Specimen Description   Final    BLOOD BLOOD RIGHT FOREARM Performed at [facility], [address] [place], [place] [address]    Special Requests   Final    BOTTLES DRAWN AEROBIC AND ANAEROBIC Blood Culture adequate volume Performed at [facility], [address] [place],  [address]    Culture  Setup Time   Final    GRAM POSITIVE COCCI IN BOTH AEROBIC AND ANAEROBIC BOTTLES CRITICAL RESULT CALLED TO, READ BACK BY AND VERIFIED WITH:  AMY THOMPSON AT 2841 ON 09/17/20 SNG Performed at [facility], [place] [address] [place],  [address]    Culture METHICILLIN RESISTANT STAPHYLOCOCCUS AUREUS (A)  Final   Report Status 09/19/2020 FINAL  Final   Organism ID, Bacteria METHICILLIN RESISTANT STAPHYLOCOCCUS AUREUS  Final      Susceptibility   Methicillin resistant staphylococcus aureus - MIC*    CIPROFLOXACIN >=8 RESISTANT Resistant     ERYTHROMYCIN >=8 RESISTANT Resistant     GENTAMICIN <=0.5 SENSITIVE Sensitive     OXACILLIN >=4 RESISTANT Resistant     TETRACYCLINE <=1 SENSITIVE Sensitive     VANCOMYCIN <=0.5 SENSITIVE Sensitive     TRIMETH/SULFA >=320 RESISTANT Resistant     CLINDAMYCIN <=0.25 SENSITIVE Sensitive     RIFAMPIN <=0.5 SENSITIVE Sensitive     Inducible Clindamycin NEGATIVE Sensitive     * METHICILLIN RESISTANT STAPHYLOCOCCUS AUREUS  Blood Culture ID Panel (Reflexed)     Status: Abnormal   Collection Time: 09/16/20  8:40 PM  Result Value Ref Range Status   Enterococcus faecalis NOT DETECTED NOT DETECTED Final   Enterococcus Faecium NOT DETECTED NOT DETECTED Final   Listeria monocytogenes NOT DETECTED NOT DETECTED Final   Staphylococcus species DETECTED (A) NOT DETECTED Final    Comment: CRITICAL RESULT CALLED TO, READ BACK BY AND VERIFIED WITH: AMY THOMPSON AT 1027 ON 09/17/20 SNG    Staphylococcus aureus (BCID) DETECTED (A) NOT DETECTED Final    Comment: Methicillin (oxacillin)-resistant Staphylococcus aureus (MRSA). MRSA is predictably resistant to beta-lactam antibiotics (except ceftaroline). Preferred therapy is vancomycin unless clinically contraindicated. Patient requires contact precautions if  hospitalized. CRITICAL RESULT CALLED TO, READ BACK BY AND VERIFIED WITH: AMY THOMPSON AT 2536 ON 09/17/20 SNG    Staphylococcus epidermidis NOT DETECTED NOT  DETECTED Final   Staphylococcus lugdunensis NOT DETECTED NOT DETECTED Final   Streptococcus species NOT DETECTED NOT DETECTED Final    Streptococcus agalactiae NOT DETECTED NOT DETECTED Final   Streptococcus pneumoniae NOT DETECTED NOT DETECTED Final   Streptococcus pyogenes NOT DETECTED NOT DETECTED Final   A.calcoaceticus-baumannii NOT DETECTED NOT DETECTED Final   Bacteroides fragilis NOT DETECTED NOT DETECTED Final   Enterobacterales NOT DETECTED NOT DETECTED Final   Enterobacter cloacae complex NOT DETECTED NOT DETECTED Final   Escherichia coli NOT DETECTED NOT DETECTED Final   Klebsiella aerogenes NOT DETECTED NOT DETECTED Final   Klebsiella oxytoca NOT DETECTED NOT DETECTED Final   Klebsiella pneumoniae NOT DETECTED NOT DETECTED Final   Proteus species NOT DETECTED NOT DETECTED Final   Salmonella species NOT DETECTED NOT DETECTED Final   Serratia marcescens NOT DETECTED NOT DETECTED Final   Haemophilus influenzae NOT DETECTED NOT DETECTED Final   Neisseria meningitidis NOT DETECTED NOT DETECTED Final   Pseudomonas aeruginosa NOT DETECTED NOT DETECTED Final   Stenotrophomonas maltophilia NOT DETECTED NOT DETECTED Final   Candida albicans NOT DETECTED NOT DETECTED Final   Candida auris NOT DETECTED NOT DETECTED Final   Candida glabrata NOT DETECTED NOT DETECTED Final   Candida krusei NOT DETECTED NOT DETECTED Final   Candida parapsilosis NOT DETECTED NOT DETECTED Final   Candida tropicalis NOT DETECTED NOT DETECTED Final   Cryptococcus neoformans/gattii NOT DETECTED NOT DETECTED Final   Meth resistant mecA/C and MREJ DETECTED (A) NOT DETECTED Final    Comment: CRITICAL RESULT CALLED TO, READ BACK BY AND VERIFIED WITH: AMY THOMPSON AT 3614 ON 09/17/20 [facility] Performed at [facility], [address] [place], [place] [address]   Resp Panel by RT-PCR (Flu A&B, Covid)     Status: None   Collection Time: 09/16/20  9:58 PM  Result Value Ref Range Status   SARS Coronavirus 2 by RT PCR NEGATIVE NEGATIVE Final    Comment: (NOTE) SARS-CoV-2 target nucleic acids are NOT DETECTED.  The SARS-CoV-2 RNA is generally  detectable in upper respiratory specimens during the acute phase of infection. The lowest concentration of SARS-CoV-2 viral copies this assay can detect is 138 copies/mL. A negative result does not preclude SARS-Cov-2 infection and should not be used as the sole basis for treatment or other patient management decisions. A negative result may occur with  improper specimen collection/handling, submission of specimen other than nasopharyngeal swab, presence of viral mutation(s) within the areas targeted by this assay, and inadequate number of viral copies(<138 copies/mL). A negative result must be combined with clinical observations, patient history, and epidemiological information. The expected result is Negative.  Fact Sheet for Patients:  EntrepreneurPulse.com.au  Fact Sheet for Healthcare Providers:  IncredibleEmployment.be  This test is no t yet approved or cleared by the [place] FDA and  has been authorized for detection and/or diagnosis of SARS-CoV-2 by FDA under an Emergency Use Authorization (EUA). This EUA will remain  in effect (meaning this test can be used) for the duration of the COVID-19 declaration under Section 564(b)(1) of the Act, 21 U.S.C.section 360bbb-3(b)(1), unless the authorization is terminated  or revoked sooner.       Influenza A by PCR NEGATIVE NEGATIVE Final   Influenza B by PCR NEGATIVE NEGATIVE Final    Comment: (NOTE) The Xpert Xpress SARS-CoV-2/FLU/RSV plus assay is intended as an aid in the diagnosis of influenza from Nasopharyngeal swab specimens and should not be used as a sole basis for treatment. Nasal washings and  aspirates are unacceptable for Xpert Xpress SARS-CoV-2/FLU/RSV testing.  Fact Sheet for Patients: EntrepreneurPulse.com.au  Fact Sheet for Healthcare Providers: IncredibleEmployment.be  This test is not yet approved or cleared by the [place] FDA  and has been authorized for detection and/or diagnosis of SARS-CoV-2 by FDA under an Emergency Use Authorization (EUA). This EUA will remain in effect (meaning this test can be used) for the duration of the COVID-19 declaration under Section 564(b)(1) of the Act, 21 U.S.C. section 360bbb-3(b)(1), unless the authorization is terminated or revoked.  Performed at [facility], [address] [place], [place] [address]   CULTURE, BLOOD (ROUTINE X 2) w Reflex to ID Panel     Status: None (Preliminary result)   Collection Time: 09/18/20  4:30 PM   Specimen: BLOOD  Result Value Ref Range Status   Specimen Description BLOOD BLOOD RIGHT ARM  Final   Special Requests   Final    BOTTLES DRAWN AEROBIC AND ANAEROBIC Blood Culture adequate volume   Culture   Final    NO GROWTH 4 DAYS Performed at [facility], [address] [place], [place] [address]    Report Status PENDING  Incomplete  CULTURE, BLOOD (ROUTINE X 2) w Reflex to ID Panel     Status: None (Preliminary result)   Collection Time: 09/18/20  4:32 PM   Specimen: BLOOD  Result Value Ref Range Status   Specimen Description BLOOD BLOOD RIGHT HAND  Final   Special Requests   Final    BOTTLES DRAWN AEROBIC AND ANAEROBIC Blood Culture adequate volume   Culture   Final    NO GROWTH 4 DAYS Performed at [facility], [address] [place], [place] [address]    Report Status PENDING  Incomplete         Radiology Studies: MR Lumbar Spine W Wo Contrast  Result Date: 09/21/2020 CLINICAL DATA:  Low back pain. Bacteremia. History of prostate cancer. EXAM: MRI LUMBAR SPINE WITHOUT AND WITH CONTRAST TECHNIQUE: Multiplanar and multiecho pulse sequences of the lumbar spine were obtained without and with intravenous contrast. CONTRAST:  66m GADAVIST GADOBUTROL 1 MMOL/ML IV SOLN COMPARISON:  None. FINDINGS: Segmentation: Normal lumbar segmentation is assumed with the lowest fully formed disc space designated  L5-S1. Alignment:  Mild lumbar levoscoliosis.  No significant listhesis. Vertebrae: No acute fracture, suspicious osseous lesion, significant marrow edema, or evidence of discitis. 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 Conus medullaris and cauda equina: Conus extends to the L1-2 level. Conus and cauda equina appear normal. Paraspinal and other soft tissues: Moderate to severe left hydroureteronephrosis, incompletely imaged including absent imaging of the distal left ureter. Partially imaged renal cysts including an approximately 7 cm cyst on the right. Disc levels: Disc desiccation from L2-3 to L4-5.  Preserved disc space heights. L1-2: Negative. L2-3: Left eccentric disc bulging and mild facet hypertrophy without stenosis. L3-4: Disc bulging and mild facet hypertrophy result in mild right greater than left lateral recess stenosis and mild right neural foraminal stenosis without spinal stenosis. L4-5: Disc bulging and moderate right and mild left facet hypertrophy result in borderline bilateral lateral recess stenosis and mild right neural foraminal stenosis without spinal stenosis. L5-S1: Mild to moderate right and severe left facet hypertrophy and minimal disc bulging without stenosis. IMPRESSION: 1. 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 2. Mild lateral recess and right neural foraminal stenosis at L3-4 and L4-5. 3. Moderate to severe left hydroureteronephrosis, incompletely  evaluated. Electronically Signed   By: Logan Bores M.D.   On: 09/21/2020 19:01        Scheduled Meds: . Chlorhexidine Gluconate Cloth  6 each Topical Daily  . enoxaparin (LOVENOX) injection  40 mg Subcutaneous Q24H  . feeding supplement  237 mL Oral BID BM  . insulin aspart  0-9 Units Subcutaneous TID WC   Continuous Infusions: . sodium chloride 250 mL (09/21/20 2340)  . iron sucrose    . potassium chloride    . vancomycin 500 mg (09/21/20  2342)     LOS: 5 days    Time spent: 28 minutes    Sharen Hones, MD [facility]   To contact the attending provider between 7A-7P or the covering provider during after hours 7P-7A, please log into the web site [email or web address] and access using universal [place] password for that web site. If you do not have the password, please call the hospital operator.  09/22/2020, 1:10 PM

## 2020-09-23 ENCOUNTER — Inpatient Hospital Stay: Payer: Medicare (Managed Care)

## 2020-09-23 ENCOUNTER — Encounter: Payer: Self-pay | Admitting: Internal Medicine

## 2020-09-23 DIAGNOSIS — D509 Iron deficiency anemia, unspecified: Secondary | ICD-10-CM

## 2020-09-23 DIAGNOSIS — Z8546 Personal history of malignant neoplasm of prostate: Secondary | ICD-10-CM

## 2020-09-23 DIAGNOSIS — R410 Disorientation, unspecified: Secondary | ICD-10-CM | POA: Diagnosis not present

## 2020-09-23 DIAGNOSIS — R7881 Bacteremia: Secondary | ICD-10-CM | POA: Diagnosis not present

## 2020-09-23 DIAGNOSIS — G9340 Encephalopathy, unspecified: Secondary | ICD-10-CM | POA: Diagnosis not present

## 2020-09-23 DIAGNOSIS — A4102 Sepsis due to Methicillin resistant Staphylococcus aureus: Secondary | ICD-10-CM | POA: Diagnosis not present

## 2020-09-23 DIAGNOSIS — N1330 Unspecified hydronephrosis: Secondary | ICD-10-CM | POA: Diagnosis not present

## 2020-09-23 DIAGNOSIS — B9562 Methicillin resistant Staphylococcus aureus infection as the cause of diseases classified elsewhere: Secondary | ICD-10-CM | POA: Diagnosis not present

## 2020-09-23 DIAGNOSIS — G9341 Metabolic encephalopathy: Secondary | ICD-10-CM | POA: Diagnosis not present

## 2020-09-23 DIAGNOSIS — R339 Retention of urine, unspecified: Secondary | ICD-10-CM | POA: Diagnosis not present

## 2020-09-23 LAB — CBC WITH DIFFERENTIAL/PLATELET
Abs Immature Granulocytes: 0.17 10*3/uL — ABNORMAL HIGH (ref 0.00–0.07)
Basophils Absolute: 0 10*3/uL (ref 0.0–0.1)
Basophils Relative: 0 %
Eosinophils Absolute: 0 10*3/uL (ref 0.0–0.5)
Eosinophils Relative: 0 %
HCT: 26.6 % — ABNORMAL LOW (ref 39.0–52.0)
Hemoglobin: 8.3 g/dL — ABNORMAL LOW (ref 13.0–17.0)
Immature Granulocytes: 2 %
Lymphocytes Relative: 17 %
Lymphs Abs: 1.7 10*3/uL (ref 0.7–4.0)
MCH: 23.9 pg — ABNORMAL LOW (ref 26.0–34.0)
MCHC: 31.2 g/dL (ref 30.0–36.0)
MCV: 76.7 fL — ABNORMAL LOW (ref 80.0–100.0)
Monocytes Absolute: 0.6 10*3/uL (ref 0.1–1.0)
Monocytes Relative: 6 %
Neutro Abs: 7.7 10*3/uL (ref 1.7–7.7)
Neutrophils Relative %: 75 %
Platelets: 333 10*3/uL (ref 150–400)
RBC: 3.47 MIL/uL — ABNORMAL LOW (ref 4.22–5.81)
RDW: 17.5 % — ABNORMAL HIGH (ref 11.5–15.5)
WBC: 10.2 10*3/uL (ref 4.0–10.5)
nRBC: 0 % (ref 0.0–0.2)

## 2020-09-23 LAB — BASIC METABOLIC PANEL
Anion gap: 6 (ref 5–15)
BUN: 10 mg/dL (ref 8–23)
CO2: 26 mmol/L (ref 22–32)
Calcium: 8 mg/dL — ABNORMAL LOW (ref 8.9–10.3)
Chloride: 103 mmol/L (ref 98–111)
Creatinine, Ser: 0.73 mg/dL (ref 0.61–1.24)
GFR, Estimated: >60 mL/min (ref >60)
Glucose, Bld: 100 mg/dL — ABNORMAL HIGH (ref 70–99)
Potassium: 4.1 mmol/L (ref 3.5–5.1)
Sodium: 135 mmol/L (ref 135–145)

## 2020-09-23 LAB — CULTURE, BLOOD (ROUTINE X 2)
Culture: NO GROWTH
Culture: NO GROWTH
Special Requests: ADEQUATE
Special Requests: ADEQUATE

## 2020-09-23 LAB — GLUCOSE, CAPILLARY
Glucose-Capillary: 106 mg/dL — ABNORMAL HIGH (ref 70–99)
Glucose-Capillary: 151 mg/dL — ABNORMAL HIGH (ref 70–99)
Glucose-Capillary: 148 mg/dL — ABNORMAL HIGH (ref 70–99)
Glucose-Capillary: 125 mg/dL — ABNORMAL HIGH (ref 70–99)

## 2020-09-23 LAB — MAGNESIUM: Magnesium: 1.9 mg/dL (ref 1.7–2.4)

## 2020-09-23 LAB — HIV ANTIBODY (ROUTINE TESTING W REFLEX): HIV Screen 4th Generation wRfx: NONREACTIVE

## 2020-09-23 LAB — HEPATITIS PANEL, ACUTE
HCV Ab: NONREACTIVE
Hep A IgM: NONREACTIVE
Hep B C IgM: NONREACTIVE
Hepatitis B Surface Ag: NONREACTIVE

## 2020-09-23 MED ORDER — IOHEXOL 9 MG/ML PO SOLN
500.0000 mL | ORAL | Status: AC
  Administered 2020-09-23 (×2): 500 mL via ORAL

## 2020-09-23 MED ORDER — GADOBUTROL 1 MMOL/ML IV SOLN
6.0000 mL | Freq: Once | INTRAVENOUS | Status: AC | PRN
  Administered 2020-09-23: 6 mL via INTRAVENOUS

## 2020-09-23 MED ORDER — IOHEXOL 300 MG/ML  SOLN
100.0000 mL | Freq: Once | INTRAMUSCULAR | Status: AC | PRN
  Administered 2020-09-23: 100 mL via INTRAVENOUS

## 2020-09-23 MED ORDER — LORAZEPAM 1 MG PO TABS
1.0000 mg | ORAL_TABLET | Freq: Once | ORAL | Status: AC
  Administered 2020-09-23: 1 mg via ORAL
  Filled 2020-09-23: qty 1

## 2020-09-23 MED ORDER — DEXAMETHASONE SODIUM PHOSPHATE 4 MG/ML IJ SOLN
4.0000 mg | Freq: Four times a day (QID) | INTRAMUSCULAR | Status: DC
  Administered 2020-09-23 – 2020-09-26 (×11): 4 mg via INTRAVENOUS
  Filled 2020-09-23 (×12): qty 1
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
## 2020-09-23 NOTE — Plan of Care (Signed)
  Problem: Education: Goal: Knowledge of General Education information will improve Description: Including pain rating scale, medication(s)/side effects and non-pharmacologic comfort measures 09/23/2020 1506 by Beverlee Wilmarth J, LPN Outcome: Progressing 09/23/2020 1506 by Kerney Elbe, LPN Outcome: Progressing   Problem: Health Behavior/Discharge Planning: Goal: Ability to manage health-related needs will improve 09/23/2020 1506 by Talik Casique J, LPN Outcome: Progressing 09/23/2020 1506 by Kerney Elbe, LPN Outcome: Progressing   Problem: Clinical Measurements: Goal: Ability to maintain clinical measurements within normal limits will improve 09/23/2020 1506 by Roxane Puerto J, LPN Outcome: Progressing 09/23/2020 1506 by Kerney Elbe, LPN Outcome: Progressing Goal: Will remain free from infection 09/23/2020 1506 by Juliann Olesky J, LPN Outcome: Progressing 09/23/2020 1506 by Kerney Elbe, LPN Outcome: Progressing Goal: Diagnostic test results will improve 09/23/2020 1506 by Artie Mcintyre J, LPN Outcome: Progressing 09/23/2020 1506 by Kerney Elbe, LPN Outcome: Progressing Goal: Respiratory complications will improve 09/23/2020 1506 by Laney Potash J, LPN Outcome: Progressing 09/23/2020 1506 by Kerney Elbe, LPN Outcome: Progressing Goal: Cardiovascular complication will be avoided 09/23/2020 1506 by Cartier Mapel J, LPN Outcome: Progressing 09/23/2020 1506 by Kerney Elbe, LPN Outcome: Progressing   Problem: Activity: Goal: Risk for activity intolerance will decrease 09/23/2020 1506 by Leah Thornberry J, LPN Outcome: Progressing 09/23/2020 1506 by Kerney Elbe, LPN Outcome: Progressing   Problem: Nutrition: Goal: Adequate nutrition will be maintained Outcome: Progressing   Problem: Coping: Goal: Level of anxiety will decrease Outcome: Progressing   Problem: Elimination: Goal: Will not experience complications related to  bowel motility Outcome: Progressing Goal: Will not experience complications related to urinary retention Outcome: Progressing   Problem: Pain Managment: Goal: General experience of comfort will improve Outcome: Progressing   Problem: Safety: Goal: Ability to remain free from injury will improve Outcome: Progressing   Problem: Skin Integrity: Goal: Risk for impaired skin integrity will decrease Outcome: Progressing   Problem: Education: Goal: Knowledge of [facility] Education information/materials will improve Outcome: Progressing Goal: Emotional status will improve Outcome: Progressing Goal: Mental status will improve Outcome: Progressing Goal: Verbalization of understanding the information provided will improve Outcome: Progressing   Problem: Activity: Goal: Interest or engagement in activities will improve Outcome: Progressing Goal: Sleeping patterns will improve Outcome: Progressing   Problem: Coping: Goal: Ability to verbalize frustrations and anger appropriately will improve Outcome: Progressing Goal: Ability to demonstrate self-control will improve Outcome: Progressing   Problem: Health Behavior/Discharge Planning: Goal: Identification of resources available to assist in meeting health care needs will improve Outcome: Progressing Goal: Compliance with treatment plan for underlying cause of condition will improve Outcome: Progressing   Problem: Physical Regulation: Goal: Ability to maintain clinical measurements within normal limits will improve Outcome: Progressing   Problem: Safety: Goal: Periods of time without injury will increase Outcome: Progressing

## 2020-09-23 NOTE — Progress Notes (Addendum)
PROGRESS NOTE    Rick Marquez  PFX:[id number] DOB: Jul 23, 1940 DOA: 09/16/2020 PCP: Patient, No Pcp Per   Chief complaint.  Altered mental status. Brief Narrative:  Rick Marquez a 80 y.o.malewithunknown medical history who presents to the ED for evaluation of confusion. History is limited from patient due to confusion.In the emergency room, he met sepsis criteria with tachycardia, tachypnea and leukocytosis. His blood culture was positive for MRSA. He is covered with vancomycin. Patient has some lower back pain, MRI of the lumbar spine did not show evidence of discitis.   Assessment & Plan:   Principal Problem:   Subacute delirium Active Problems:   Sepsis ([place])   Acute lower UTI   Anemia   Acute metabolic encephalopathy   Urinary retention   MRSA (methicillin resistant Staphylococcus aureus) septicemia ([place])  #1.  Sepsis with MRSA septicemia. Etiology still unclear.  No spinal osteomyelitis. Patient has left hydronephrosis, but urine culture did not grow MRSA. TEE is considered, however, due to patient altered mental status, he is not able to consent. For now, continue vancomycin. Appreciate ID consult.  #2.  Acute metabolic encephalopathy. 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  I will check ammonia level, also obtain brain MRI, consider neurology consult after MRI. Social worker is talking to patient's stepdaughter, currently, patient does not have biological children.  Consider guardianship.  #3.  Iron deficient anemia. Status post IV iron, continue to follow CBC.  4.  Severe protein calorie malnutrition. Continue supplements.  5.  Urinary tract infection. Patient has completed antibiotics per  6.  Hyponatremia.  Condition has improved.  7.  Hydronephrosis. Renal ultrasound showed significant left-sided hydronephrosis.  Urology is following.   Addendum: 3299. MRI brain showed multiple metastatic lesions. Consult  oncology and start decadron.   DVT prophylaxis: Lovenox Code Status:  Family Communication:  Disposition Plan:  .   Status is: Inpatient  Remains inpatient appropriate because:Inpatient level of care appropriate due to severity of illness   Dispo: The patient is from: Home              Anticipated d/c is to: Home              Patient currently is not medically stable to d/c.   Difficult to place patient No        I/O last 3 completed shifts: In: 397.9 [I.V.:201.7; IV Piggyback:196.3] Out: 3900 [Urine:3900] No intake/output data recorded.     Consultants:   ID  Procedures: None  Antimicrobials:  Vancomycin  Subjective: Patient is very confused today, he is also agitated.  He refused transesophageal echocardiogram.  No significant short of breath or cough. No abdominal pain or nausea vomiting. No dysuria hematuria.  Objective: Vitals:   09/22/20 1930 09/23/20 0551 09/23/20 0809 09/23/20 1257  BP: 108/73 131/71 127/84 111/79  Pulse: 79 89 90 97  Resp: '18 20 16 16  ' Temp: 98.1 F (36.7 C) 97.9 F (36.6 C) 97.6 F (36.4 C) 98 F (36.7 C)  TempSrc: Oral Oral    SpO2: 100% 99% 99% 99%  Weight:      Height:        Intake/Output Summary (Last 24 hours) at 09/23/2020 1300 Last data filed at 09/23/2020 0600 Gross per 24 hour  Intake 397.91 ml  Output 1800 ml  Net -1402.09 ml   Filed Weights   09/16/20 1940  Weight: 68 kg    Examination:  General exam: Appears calm and comfortable  Respiratory system: Clear to auscultation. Respiratory effort normal. Cardiovascular system: S1 & S2 heard, RRR. No JVD, murmurs, rubs, gallops or clicks. No pedal edema. Gastrointestinal system: Abdomen is nondistended, soft and nontender. No organomegaly or masses felt. Normal bowel sounds heard. Central nervous system: Alert and oriented. No focal neurological deficits. Extremities: Symmetric 5 x 5 power. Skin: No rashes, lesions or ulcers Psychiatry: Judgement and  insight appear normal. Mood & affect appropriate.     Data Reviewed: I have personally reviewed following labs and imaging studies  CBC: Recent Labs  Lab 09/16/20 1947 09/17/20 0336 09/20/20 0445 09/22/20 0701 09/23/20 0534  WBC 14.6* 14.8* 9.3 11.5* 10.2  NEUTROABS  --   --  7.0 8.9* 7.7  HGB 8.8* 8.5* 7.9* 7.7* 8.3*  HCT 29.3* 28.6* 26.7* 26.1* 26.6*  MCV 78.1* 78.8* 79.0* 78.6* 76.7*  PLT 353 249 273 296 628   Basic Metabolic Panel: Recent Labs  Lab 09/16/20 1947 09/17/20 0336 09/18/20 0442 09/19/20 0426 09/20/20 0445 09/21/20 0459 09/22/20 0701 09/23/20 0534  NA 132* 133*  --   --  137  --  138 135  K 3.5 3.6  --   --  3.5  --  3.3* 4.1  CL 99 102  --   --  102  --  103 103  CO2 23 22  --   --  26  --  27 26  GLUCOSE 170* 150*  --   --  109*  --  109* 100*  BUN 20 17  --   --  14  --  11 10  CREATININE 1.03 0.92   < > 0.76 0.72 0.71 0.74 0.73  CALCIUM 8.2* 7.8*  --   --  7.9*  --  7.9* 8.0*  MG  --   --   --   --   --   --  2.0 1.9   < > = values in this interval not displayed.   GFR: Estimated Creatinine Clearance: 73.2 mL/min (by C-G formula based on SCr of 0.73 mg/dL). Liver Function Tests: Recent Labs  Lab 09/16/20 1947 09/20/20 0445  AST 21 23  ALT 13 13  ALKPHOS 108 169*  BILITOT 0.6 0.6  PROT 8.1 6.4*  ALBUMIN 2.7* 2.0*   No results for input(s): LIPASE, AMYLASE in the last 168 hours. No results for input(s): AMMONIA in the last 168 hours. Coagulation Profile: No results for input(s): INR, PROTIME in the last 168 hours. Cardiac Enzymes: No results for input(s): CKTOTAL, CKMB, CKMBINDEX, TROPONINI in the last 168 hours. BNP (last 3 results) No results for input(s): PROBNP in the last 8760 hours. HbA1C: No results for input(s): HGBA1C in the last 72 hours. CBG: Recent Labs  Lab 09/22/20 1207 09/22/20 1639 09/22/20 2106 09/23/20 0806 09/23/20 1258  GLUCAP 94 79 125* 106* 151*   Lipid Profile: No results for input(s): CHOL, HDL,  LDLCALC, TRIG, CHOLHDL, LDLDIRECT in the last 72 hours. Thyroid Function Tests: No results for input(s): TSH, T4TOTAL, FREET4, T3FREE, THYROIDAB in the last 72 hours. Anemia Panel: Recent Labs    09/21/20 0801  VITAMINB12 706  TIBC 113*  IRON 12*   Sepsis Labs: Recent Labs  Lab 09/16/20 1947 09/17/20 0336  PROCALCITON  --  0.92  LATICACIDVEN 1.8  --     Recent Results (from the past 240 hour(s))  Urine culture     Status: Abnormal   Collection Time: 09/16/20  7:47 PM   Specimen: Urine, Random  Result Value Ref  Range Status   Specimen Description   Final    URINE, RANDOM Performed at [facility], [address] [place], [place] [address]    Special Requests   Final    NONE Performed at [facility], [address] [place], [place] [address]    Culture MULTIPLE SPECIES PRESENT, SUGGEST RECOLLECTION (A)  Final   Report Status 09/18/2020 FINAL  Final  Blood culture (routine x 2)     Status: Abnormal   Collection Time: 09/16/20  7:47 PM   Specimen: BLOOD  Result Value Ref Range Status   Specimen Description   Final    BLOOD RIGHT ANTECUBITAL Performed at [facility], [address] [place], [place] [address]    Special Requests   Final    BOTTLES DRAWN AEROBIC AND ANAEROBIC Blood Culture adequate volume Performed at [facility], [address] [place], [place] [address]    Culture  Setup Time   Final    GRAM POSITIVE COCCI IN BOTH AEROBIC AND ANAEROBIC BOTTLES CRITICAL VALUE NOTED.  VALUE IS CONSISTENT WITH PREVIOUSLY REPORTED AND CALLED VALUE. Performed at [facility], [address] [place], [place] [address]    Culture (A)  Final    STAPHYLOCOCCUS AUREUS SUSCEPTIBILITIES PERFORMED ON PREVIOUS CULTURE WITHIN THE LAST 5 DAYS. Performed at [facility], [place] [address], [place], [place] [address]    Report Status 09/19/2020 FINAL  Final  Blood culture (routine x 2)     Status: Abnormal   Collection  Time: 09/16/20  8:40 PM   Specimen: BLOOD  Result Value Ref Range Status   Specimen Description   Final    BLOOD BLOOD RIGHT FOREARM Performed at [facility], [address] [place], [place] [address]    Special Requests   Final    BOTTLES DRAWN AEROBIC AND ANAEROBIC Blood Culture adequate volume Performed at [facility], [address] [place], [place] [address]    Culture  Setup Time   Final    GRAM POSITIVE COCCI IN BOTH AEROBIC AND ANAEROBIC BOTTLES CRITICAL RESULT CALLED TO, READ BACK BY AND VERIFIED WITH: AMY THOMPSON AT 8592 ON 09/17/20 SNG Performed at [facility], [place] [address], [place], [place] [address]    Culture METHICILLIN RESISTANT STAPHYLOCOCCUS AUREUS (A)  Final   Report Status 09/19/2020 FINAL  Final   Organism ID, Bacteria METHICILLIN RESISTANT STAPHYLOCOCCUS AUREUS  Final      Susceptibility   Methicillin resistant staphylococcus aureus - MIC*    CIPROFLOXACIN >=8 RESISTANT Resistant     ERYTHROMYCIN >=8 RESISTANT Resistant     GENTAMICIN <=0.5 SENSITIVE Sensitive     OXACILLIN >=4 RESISTANT Resistant     TETRACYCLINE <=1 SENSITIVE Sensitive     VANCOMYCIN <=0.5 SENSITIVE Sensitive     TRIMETH/SULFA >=320 RESISTANT Resistant     CLINDAMYCIN <=0.25 SENSITIVE Sensitive     RIFAMPIN <=0.5 SENSITIVE Sensitive     Inducible Clindamycin NEGATIVE Sensitive     * METHICILLIN RESISTANT STAPHYLOCOCCUS AUREUS  Blood Culture ID Panel (Reflexed)     Status: Abnormal   Collection Time: 09/16/20  8:40 PM  Result Value Ref Range Status   Enterococcus faecalis NOT DETECTED NOT DETECTED Final   Enterococcus Faecium NOT DETECTED NOT DETECTED Final   Listeria monocytogenes NOT DETECTED NOT DETECTED Final   Staphylococcus species DETECTED (A) NOT DETECTED Final    Comment: CRITICAL RESULT CALLED TO, READ BACK BY AND VERIFIED WITH: AMY THOMPSON AT  9244 ON 09/17/20 SNG    Staphylococcus aureus (BCID) DETECTED (A) NOT DETECTED Final    Comment:  Methicillin (oxacillin)-resistant Staphylococcus aureus (MRSA). MRSA is predictably resistant to beta-lactam antibiotics (except ceftaroline). Preferred therapy is vancomycin unless clinically contraindicated. Patient requires contact precautions if  hospitalized. CRITICAL RESULT CALLED TO, READ BACK BY AND VERIFIED WITH: AMY THOMPSON AT 6286 ON 09/17/20 SNG    Staphylococcus epidermidis NOT DETECTED NOT DETECTED Final   Staphylococcus lugdunensis NOT DETECTED NOT DETECTED Final   Streptococcus species NOT DETECTED NOT DETECTED Final   Streptococcus agalactiae NOT DETECTED NOT DETECTED Final   Streptococcus pneumoniae NOT DETECTED NOT DETECTED Final   Streptococcus pyogenes NOT DETECTED NOT DETECTED Final   A.calcoaceticus-baumannii NOT DETECTED NOT DETECTED Final   Bacteroides fragilis NOT DETECTED NOT DETECTED Final   Enterobacterales NOT DETECTED NOT DETECTED Final   Enterobacter cloacae complex NOT DETECTED NOT DETECTED Final   Escherichia coli NOT DETECTED NOT DETECTED Final   Klebsiella aerogenes NOT DETECTED NOT DETECTED Final   Klebsiella oxytoca NOT DETECTED NOT DETECTED Final   Klebsiella pneumoniae NOT DETECTED NOT DETECTED Final   Proteus species NOT DETECTED NOT DETECTED Final   Salmonella species NOT DETECTED NOT DETECTED Final   Serratia marcescens NOT DETECTED NOT DETECTED Final   Haemophilus influenzae NOT DETECTED NOT DETECTED Final   Neisseria meningitidis NOT DETECTED NOT DETECTED Final   Pseudomonas aeruginosa NOT DETECTED NOT DETECTED Final   Stenotrophomonas maltophilia NOT DETECTED NOT DETECTED Final   Candida albicans NOT DETECTED NOT DETECTED Final   Candida auris NOT DETECTED NOT DETECTED Final   Candida glabrata NOT DETECTED NOT DETECTED Final   Candida krusei NOT DETECTED NOT DETECTED Final   Candida parapsilosis NOT DETECTED NOT DETECTED Final   Candida tropicalis NOT DETECTED NOT DETECTED Final   Cryptococcus neoformans/gattii NOT DETECTED NOT DETECTED  Final   Meth resistant mecA/C and MREJ DETECTED (A) NOT DETECTED Final    Comment: CRITICAL RESULT CALLED TO, READ BACK BY AND VERIFIED WITH: AMY THOMPSON AT 3817 ON 09/17/20 [facility] Performed at [facility], [address] [place],  [address]   Resp Panel by RT-PCR (Flu A&B, Covid)     Status: None   Collection Time: 09/16/20  9:58 PM  Result Value Ref Range Status   SARS Coronavirus 2 by RT PCR NEGATIVE NEGATIVE Final    Comment: (NOTE) SARS-CoV-2 target nucleic acids are NOT DETECTED.  The SARS-CoV-2 RNA is generally detectable in upper respiratory specimens during the acute phase of infection. The lowest concentration of SARS-CoV-2 viral copies this assay can detect is 138 copies/mL. A negative result does not preclude SARS-Cov-2 infection and should not be used as the sole basis for treatment or other patient management decisions. A negative result may occur with  improper specimen collection/handling, submission of specimen other than nasopharyngeal swab, presence of viral mutation(s) within the areas targeted by this assay, and inadequate number of viral copies(<138 copies/mL). A negative result must be combined with clinical observations, patient history, and epidemiological information. The expected result is Negative.  Fact Sheet for Patients:  EntrepreneurPulse.com.au  Fact Sheet for Healthcare Providers:  IncredibleEmployment.be  This test is no t yet approved or cleared by the [place] FDA and  has been authorized for detection and/or diagnosis of SARS-CoV-2 by FDA under an Emergency Use Authorization (EUA). This EUA will remain  in effect (meaning this test can be used) for the duration of the COVID-19 declaration under Section 564(b)(1) of the Act, 21  U.S.C.section 360bbb-3(b)(1), unless the authorization is terminated  or revoked sooner.       Influenza A by PCR NEGATIVE NEGATIVE Final   Influenza B by PCR  NEGATIVE NEGATIVE Final    Comment: (NOTE) The Xpert Xpress SARS-CoV-2/FLU/RSV plus assay is intended as an aid in the diagnosis of influenza from Nasopharyngeal swab specimens and should not be used as a sole basis for treatment. Nasal washings and aspirates are unacceptable for Xpert Xpress SARS-CoV-2/FLU/RSV testing.  Fact Sheet for Patients: EntrepreneurPulse.com.au  Fact Sheet for Healthcare Providers: IncredibleEmployment.be  This test is not yet approved or cleared by the [place] FDA and has been authorized for detection and/or diagnosis of SARS-CoV-2 by FDA under an Emergency Use Authorization (EUA). This EUA will remain in effect (meaning this test can be used) for the duration of the COVID-19 declaration under Section 564(b)(1) of the Act, 21 U.S.C. section 360bbb-3(b)(1), unless the authorization is terminated or revoked.  Performed at [facility], [address] [place], [place] [address]   CULTURE, BLOOD (ROUTINE X 2) w Reflex to ID Panel     Status: None   Collection Time: 09/18/20  4:30 PM   Specimen: BLOOD  Result Value Ref Range Status   Specimen Description BLOOD BLOOD RIGHT ARM  Final   Special Requests   Final    BOTTLES DRAWN AEROBIC AND ANAEROBIC Blood Culture adequate volume   Culture   Final    NO GROWTH 5 DAYS Performed at [facility], [address] [place], [place] [address]    Report Status 09/23/2020 FINAL  Final  CULTURE, BLOOD (ROUTINE X 2) w Reflex to ID Panel     Status: None   Collection Time: 09/18/20  4:32 PM   Specimen: BLOOD  Result Value Ref Range Status   Specimen Description BLOOD BLOOD RIGHT HAND  Final   Special Requests   Final    BOTTLES DRAWN AEROBIC AND ANAEROBIC Blood Culture adequate volume   Culture   Final    NO GROWTH 5 DAYS Performed at [facility], [address] [place], [place] [address]    Report Status 09/23/2020 FINAL  Final          Radiology Studies: MR Lumbar Spine W Wo Contrast  Result Date: 09/21/2020 CLINICAL DATA:  Low back pain. Bacteremia. History of prostate cancer. EXAM: MRI LUMBAR SPINE WITHOUT AND WITH CONTRAST TECHNIQUE: Multiplanar and multiecho pulse sequences of the lumbar spine were obtained without and with intravenous contrast. CONTRAST:  37m GADAVIST GADOBUTROL 1 MMOL/ML IV SOLN COMPARISON:  None. FINDINGS: Segmentation: Normal lumbar segmentation is assumed with the lowest fully formed disc space designated L5-S1. Alignment:  Mild lumbar levoscoliosis.  No significant listhesis. Vertebrae: No acute fracture, suspicious osseous lesion, significant marrow edema, or evidence of discitis. 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 Conus medullaris and cauda equina: Conus extends to the L1-2 level. Conus and cauda equina appear normal. Paraspinal and other soft tissues: Moderate to severe left hydroureteronephrosis, incompletely imaged including absent imaging of the distal left ureter. Partially imaged renal cysts including an approximately 7 cm cyst on the right. Disc levels: Disc desiccation from L2-3 to L4-5.  Preserved disc space heights. L1-2: Negative. L2-3: Left eccentric disc bulging and mild facet hypertrophy without stenosis. L3-4: Disc bulging and mild facet hypertrophy result in mild right greater than left lateral recess stenosis and mild right neural foraminal stenosis without spinal stenosis. L4-5: Disc bulging and moderate right and mild left facet hypertrophy result in  borderline bilateral lateral recess stenosis and mild right neural foraminal stenosis without spinal stenosis. L5-S1: Mild to moderate right and severe left facet hypertrophy and minimal disc bulging without stenosis. IMPRESSION: 1. 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 2. Mild lateral recess and right neural foraminal stenosis at L3-4 and L4-5. 3.  Moderate to severe left hydroureteronephrosis, incompletely evaluated. Electronically Signed   By: Logan Bores M.D.   On: 09/21/2020 19:01   US RENAL  Result Date: 09/22/2020 CLINICAL DATA:  Hydronephrosis. Hydronephrosis on lumbar MRI yesterday. EXAM: RENAL / URINARY TRACT ULTRASOUND COMPLETE COMPARISON:  Lumbar spine MRI yesterday FINDINGS: Right Kidney: Renal measurements: 14.2 x 6.7 x 6.4 cm = volume: 322 mL. Parenchymal echogenicity is normal. No hydronephrosis. There is a 7.6 x 6.8 x 6.6 cm simple cyst arising from the upper pole. Smaller cortical cyst measures approximately 10 mm in the mid kidney. There is no evidence of solid lesion. No visualized renal calculi. Left Kidney: Renal measurements: 14.1 x 6.7 x 6.8 cm = volume: 336 mL. Moderate to severe hydronephrosis. Dilated included ureter. Cause not delineated on this ultrasound. No obvious renal calculi or focal renal lesion. Bladder: Decompressed by Foley catheter and not well evaluated. Other: None. IMPRESSION: 1. Moderate to severe left hydronephrosis and hydroureter. Cause is not delineated on this ultrasound. No renal calculi are seen. Consider CT to assess for cause of distal ureteral obstruction. 2. Right renal cysts.  No right hydronephrosis. 3. Bladder decompressed by Foley catheter and not evaluated. Electronically Signed   By: Keith Rake M.D.   On: 09/22/2020 15:25        Scheduled Meds: . Chlorhexidine Gluconate Cloth  6 each Topical Daily  . enoxaparin (LOVENOX) injection  40 mg Subcutaneous Q24H  . feeding supplement  237 mL Oral BID BM  . insulin aspart  0-9 Units Subcutaneous TID WC   Continuous Infusions: . sodium chloride 250 mL (09/21/20 2340)  . vancomycin 500 mg (09/23/20 1015)     LOS: 6 days    Time spent: 34 minutes    Sharen Hones, MD [facility]   To contact the attending provider between 7A-7P or the covering provider during after hours 7P-7A, please log into the web site  [email or web address] and access using universal [place] password for that web site. If you do not have the password, please call the hospital operator.  09/23/2020, 1:00 PM
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
## 2020-09-24 ENCOUNTER — Ambulatory Visit: Payer: Medicare (Managed Care) | Admitting: Radiation Oncology

## 2020-09-24 ENCOUNTER — Encounter
Admission: EM | Disposition: A | Discharge status: Home / Self Care | Payer: Self-pay | Source: Home / Self Care | Attending: Internal Medicine

## 2020-09-24 DIAGNOSIS — R1900 Intra-abdominal and pelvic swelling, mass and lump, unspecified site: Secondary | ICD-10-CM

## 2020-09-24 DIAGNOSIS — R4182 Altered mental status, unspecified: Secondary | ICD-10-CM | POA: Diagnosis not present

## 2020-09-24 DIAGNOSIS — G9341 Metabolic encephalopathy: Secondary | ICD-10-CM | POA: Diagnosis not present

## 2020-09-24 DIAGNOSIS — R918 Other nonspecific abnormal finding of lung field: Secondary | ICD-10-CM

## 2020-09-24 DIAGNOSIS — Z515 Encounter for palliative care: Secondary | ICD-10-CM | POA: Diagnosis not present

## 2020-09-24 DIAGNOSIS — B9562 Methicillin resistant Staphylococcus aureus infection as the cause of diseases classified elsewhere: Secondary | ICD-10-CM | POA: Diagnosis not present

## 2020-09-24 DIAGNOSIS — R410 Disorientation, unspecified: Secondary | ICD-10-CM | POA: Diagnosis not present

## 2020-09-24 DIAGNOSIS — N390 Urinary tract infection, site not specified: Secondary | ICD-10-CM | POA: Diagnosis not present

## 2020-09-24 DIAGNOSIS — R7881 Bacteremia: Secondary | ICD-10-CM | POA: Diagnosis not present

## 2020-09-24 DIAGNOSIS — C7931 Secondary malignant neoplasm of brain: Secondary | ICD-10-CM

## 2020-09-24 DIAGNOSIS — C801 Malignant (primary) neoplasm, unspecified: Secondary | ICD-10-CM

## 2020-09-24 DIAGNOSIS — A4102 Sepsis due to Methicillin resistant Staphylococcus aureus: Secondary | ICD-10-CM | POA: Diagnosis not present

## 2020-09-24 LAB — CBC WITH DIFFERENTIAL/PLATELET
Abs Immature Granulocytes: 0.1 10*3/uL — ABNORMAL HIGH (ref 0.00–0.07)
Basophils Absolute: 0 10*3/uL (ref 0.0–0.1)
Basophils Relative: 0 %
Eosinophils Absolute: 0 10*3/uL (ref 0.0–0.5)
Eosinophils Relative: 0 %
HCT: 27.9 % — ABNORMAL LOW (ref 39.0–52.0)
Hemoglobin: 8.5 g/dL — ABNORMAL LOW (ref 13.0–17.0)
Immature Granulocytes: 2 %
Lymphocytes Relative: 24 %
Lymphs Abs: 1.2 10*3/uL (ref 0.7–4.0)
MCH: 23.8 pg — ABNORMAL LOW (ref 26.0–34.0)
MCHC: 30.5 g/dL (ref 30.0–36.0)
MCV: 78.2 fL — ABNORMAL LOW (ref 80.0–100.0)
Monocytes Absolute: 0.1 10*3/uL (ref 0.1–1.0)
Monocytes Relative: 2 %
Neutro Abs: 3.7 10*3/uL (ref 1.7–7.7)
Neutrophils Relative %: 72 %
Platelets: 323 10*3/uL (ref 150–400)
RBC: 3.57 MIL/uL — ABNORMAL LOW (ref 4.22–5.81)
RDW: 17.4 % — ABNORMAL HIGH (ref 11.5–15.5)
WBC: 5.2 10*3/uL (ref 4.0–10.5)
nRBC: 0 % (ref 0.0–0.2)

## 2020-09-24 LAB — BASIC METABOLIC PANEL
Anion gap: 8 (ref 5–15)
BUN: 16 mg/dL (ref 8–23)
CO2: 25 mmol/L (ref 22–32)
Calcium: 8.2 mg/dL — ABNORMAL LOW (ref 8.9–10.3)
Chloride: 105 mmol/L (ref 98–111)
Creatinine, Ser: 0.78 mg/dL (ref 0.61–1.24)
GFR, Estimated: >60 mL/min (ref >60)
Glucose, Bld: 199 mg/dL — ABNORMAL HIGH (ref 70–99)
Potassium: 4.2 mmol/L (ref 3.5–5.1)
Sodium: 138 mmol/L (ref 135–145)

## 2020-09-24 LAB — MAGNESIUM: Magnesium: 2.3 mg/dL (ref 1.7–2.4)

## 2020-09-24 LAB — GLUCOSE, CAPILLARY
Glucose-Capillary: 198 mg/dL — ABNORMAL HIGH (ref 70–99)
Glucose-Capillary: 284 mg/dL — ABNORMAL HIGH (ref 70–99)
Glucose-Capillary: 277 mg/dL — ABNORMAL HIGH (ref 70–99)
Glucose-Capillary: 196 mg/dL — ABNORMAL HIGH (ref 70–99)

## 2020-09-24 LAB — PSA: Prostatic Specific Antigen: 0.23 ng/mL (ref 0.00–4.00)

## 2020-09-24 LAB — RPR: RPR Ser Ql: NONREACTIVE

## 2020-09-24 SURGERY — ECHOCARDIOGRAM, TRANSESOPHAGEAL
Anesthesia: Moderate Sedation

## 2020-09-24 MED ORDER — HALOPERIDOL LACTATE 5 MG/ML IJ SOLN
1.0000 mg | Freq: Four times a day (QID) | INTRAMUSCULAR | Status: DC | PRN
  Administered 2020-09-25 – 2020-09-29 (×2): 1 mg via INTRAVENOUS
  Filled 2020-09-24 (×2): qty 1

## 2020-09-24 NOTE — Progress Notes (Signed)
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 lesion, and multiple brain metastasis.  Melanoma is a possibility.  Case discussed with interventional radiology and will do ultrasound-guided biopsy of right inguinal lymph node tomorrow.  Tumor markers have been ordered and are pending at time of dictation.  Have consulted palliative care. 2.  Confusion: Improved with Decadron.  Appreciate radiation oncology input.  No treatment plans as patient has stated he wishes to go home to get treatment. 3.  Disposition: Patient has a daughter who lives in [place].  He apparently lives between [place] and [place].  He mentioned a cancer center in [place] near his home there that he wishes to receive treatment.  Appreciate consult, will follow.  Lloyd Huger, MD   09/24/2020 2:18 PM

## 2020-09-24 NOTE — Consult Note (Signed)
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 name the city.  He had occasional word searching but was mostly able to participate in a meaningful conversation regarding his current medical problems.  He recalls being told that he has cancer.  It is his intention to return to [place] and pursue work-up/treatment there.  Patient gave me permission to contact his family.  I tried calling his wife Linward Headland at [phone] but was unable to reach  her.   I did speak with his stepdaughter, [name].  [name] says that patient has had progressive confusion over the past several months.  He was living in [place] but traveling every several weeks back to [place] to receive active treatment for his prostate cancer.  She says that the last 4 trips to [place] have resulted in him becoming lost and requiring assistance to find his way home.  She says this trip originated with patient getting in the car and saying that he was driving to [place] to "go get some shrimp".  She has no idea how he ended up in [place].   It is her intention to bring patient back to [place] and pursue establishing both primary and oncology care at [facility] in [place], [place]. However, she is not yet sure  how she will get patient to [place].  She hopes to fly him into [place] airport.  However, I have voiced concern with patient's ability to navigate an airport independently. Social work has been consulted and can help coordinate disposition.   [name] is hopeful that patient will no longer be driving. She is unaware what happened with patient's car.   PLAN: -Continue current scope of treatment -TOC to coordinate discharge planning with family  Case and plan discussed with Dr. Grayland Ormond   Time Total: 60 minutes  Visit consisted of counseling and education dealing with the complex and emotionally intense issues of symptom management and palliative care in the setting of serious and potentially life-threatening illness.Greater than 50%  of this time was spent counseling and coordinating care related to the above assessment and plan.  Signed by: Altha Harm, PhD, NP-C

## 2020-09-24 NOTE — Plan of Care (Signed)
  Problem: Education: Goal: Knowledge of General Education information will improve Description: Including pain rating scale, medication(s)/side effects and non-pharmacologic comfort measures Outcome: Progressing   Problem: Health Behavior/Discharge Planning: Goal: Ability to manage health-related needs will improve Outcome: Progressing   Problem: Clinical Measurements: Goal: Ability to maintain clinical measurements within normal limits will improve Outcome: Progressing Goal: Will remain free from infection Outcome: Progressing Goal: Diagnostic test results will improve Outcome: Progressing Goal: Respiratory complications will improve Outcome: Progressing Goal: Cardiovascular complication will be avoided Outcome: Progressing   Problem: Activity: Goal: Risk for activity intolerance will decrease Outcome: Progressing   Problem: Nutrition: Goal: Adequate nutrition will be maintained Outcome: Progressing   Problem: Coping: Goal: Level of anxiety will decrease Outcome: Progressing   Problem: Elimination: Goal: Will not experience complications related to bowel motility Outcome: Progressing Goal: Will not experience complications related to urinary retention Outcome: Progressing   Problem: Pain Managment: Goal: General experience of comfort will improve Outcome: Progressing   Problem: Safety: Goal: Ability to remain free from injury will improve Outcome: Progressing   Problem: Skin Integrity: Goal: Risk for impaired skin integrity will decrease Outcome: Progressing   Problem: Education: Goal: Knowledge of  General Education information/materials will improve Outcome: Progressing Goal: Emotional status will improve Outcome: Progressing Goal: Mental status will improve Outcome: Progressing Goal: Verbalization of understanding the information provided will improve Outcome: Progressing   Problem: Activity: Goal: Interest or engagement in activities will  improve Outcome: Progressing Goal: Sleeping patterns will improve Outcome: Progressing   Problem: Coping: Goal: Ability to verbalize frustrations and anger appropriately will improve Outcome: Progressing Goal: Ability to demonstrate self-control will improve Outcome: Progressing   Problem: Health Behavior/Discharge Planning: Goal: Identification of resources available to assist in meeting health care needs will improve Outcome: Progressing Goal: Compliance with treatment plan for underlying cause of condition will improve Outcome: Progressing   Problem: Physical Regulation: Goal: Ability to maintain clinical measurements within normal limits will improve Outcome: Progressing   Problem: Safety: Goal: Periods of time without injury will increase Outcome: Progressing   

## 2020-09-24 NOTE — Progress Notes (Signed)
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
## 2020-09-24 NOTE — Consult Note (Signed)
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 male patient admitted with "I need to get up and walk!".  HPI: Patient seen chart reviewed.  In the time since my last visit with the patient he has had an MRI scan of his brain revealing multiple metastatic tumor lesions.  Obviously puts a different light on his recent behavior.  Patient was initially pleasant seeing me today although he had no memory of talking with me before.  There were some improvements I noticed.  He was no longer insisting that he was living in [place].  He remembered that he had been living in [place].  He remembered that he had been driving and that he was brought to the hospital because he was acting strangely at a gas station.  He has no memory however of what happened during the days that he was driving around.  When informed that there were several people who had been inquiring about him he seems surprised.  Later however he seemed to get more confused.  He told me he needed to get up and get out of the hospital and there was some guy named Vonna Kotyk who is going to come and pick him up and drive him away in a truck.  Became preoccupied with this sudden need to get up and move around and started shouting that he needed to walk.  No report of any desire for self injury but did seem to be having intermittent spells of confusion  Past Psychiatric History:  None reported other than what is previously noted  Risk to Self:   Risk to Others:   Prior Inpatient Therapy:   Prior Outpatient Therapy:    Past Medical History: History reviewed. No pertinent past medical history. History reviewed. No pertinent surgical history. Family History: No family history on file. Family Psychiatric  History: See prior Social History:  Social History   Substance and Sexual Activity  Alcohol Use None     Social History   Substance and Sexual Activity  Drug Use Not on file    Social History   Socioeconomic History  . Marital status: Married    Spouse name: Not on file  . Number of children: Not on file  . Years of education: Not on file  . Highest education level: Not on file  Occupational History  . Not on file  Tobacco Use  . Smoking status: Not on file  . Smokeless tobacco: Not on file  Substance and Sexual Activity  . Alcohol use: Not on file  . Drug use: Not on file  . Sexual activity: Not on file  Other Topics Concern  . Not on file  Social History Narrative  . Not on file   Social Determinants of Health   Financial Resource Strain: Not on file  Food Insecurity: Not on file  Transportation Needs: Not on file  Physical Activity: Not on file  Stress: Not  on file  Social Connections: Not on file   Additional Social History:    Allergies:  Not on File  Labs:  Results for orders placed or performed during the hospital encounter of 09/16/20 (from the past 48 hour(s))  Glucose, capillary     Status: Abnormal   Collection Time: 09/22/20  9:06 PM  Result Value Ref Range   Glucose-Capillary 125 (H) 70 - 99 mg/dL    Comment: Glucose reference range applies only to samples taken after fasting for at least 8 hours.  CBC with Differential/Platelet     Status: Abnormal   Collection Time: 09/23/20  5:34 AM  Result Value Ref Range   WBC 10.2 4.0 - 10.5 K/uL   RBC 3.47 (L) 4.22 - 5.81 MIL/uL   Hemoglobin 8.3 (L) 13.0 - 17.0 g/dL     Comment: Reticulocyte Hemoglobin testing may be clinically indicated, consider ordering this additional test [id number]    HCT 26.6 (L) 39.0 - 52.0 %   MCV 76.7 (L) 80.0 - 100.0 fL   MCH 23.9 (L) 26.0 - 34.0 pg   MCHC 31.2 30.0 - 36.0 g/dL   RDW 17.5 (H) 11.5 - 15.5 %   Platelets 333 150 - 400 K/uL   nRBC 0.0 0.0 - 0.2 %   Neutrophils Relative % 75 %   Neutro Abs 7.7 1.7 - 7.7 K/uL   Lymphocytes Relative 17 %   Lymphs Abs 1.7 0.7 - 4.0 K/uL   Monocytes Relative 6 %   Monocytes Absolute 0.6 0.1 - 1.0 K/uL   Eosinophils Relative 0 %   Eosinophils Absolute 0.0 0.0 - 0.5 K/uL   Basophils Relative 0 %   Basophils Absolute 0.0 0.0 - 0.1 K/uL   Immature Granulocytes 2 %   Abs Immature Granulocytes 0.17 (H) 0.00 - 0.07 K/uL    Comment: Performed at [facility], [address] [place], [place] [address]  Basic metabolic panel     Status: Abnormal   Collection Time: 09/23/20  5:34 AM  Result Value Ref Range   Sodium 135 135 - 145 mmol/L   Potassium 4.1 3.5 - 5.1 mmol/L   Chloride 103 98 - 111 mmol/L   CO2 26 22 - 32 mmol/L   Glucose, Bld 100 (H) 70 - 99 mg/dL    Comment: Glucose reference range applies only to samples taken after fasting for at least 8 hours.   BUN 10 8 - 23 mg/dL   Creatinine, Ser 0.73 0.61 - 1.24 mg/dL   Calcium 8.0 (L) 8.9 - 10.3 mg/dL   GFR, Estimated >60 >60 mL/min    Comment: (NOTE) Calculated using the CKD-EPI Creatinine Equation (2021)    Anion gap 6 5 - 15    Comment: Performed at [facility], [address] [place], [place] [address]  Magnesium     Status: None   Collection Time: 09/23/20  5:34 AM  Result Value Ref Range   Magnesium 1.9 1.7 - 2.4 mg/dL    Comment: Performed at [facility], [address] [place], [place] [address]  Glucose, capillary     Status: Abnormal   Collection Time: 09/23/20  8:06 AM  Result Value Ref Range   Glucose-Capillary 106 (H) 70 - 99 mg/dL    Comment: Glucose reference range applies  only to samples taken after fasting for at least 8 hours.  HIV Antibody (routine testing w rflx)     Status: None   Collection Time: 09/23/20 11:11 AM  Result  Value Ref Range   HIV Screen 4th Generation wRfx Non Reactive Non Reactive    Comment: Performed at [facility], [place] [address] [place], [place] [address]  RPR     Status: None   Collection Time: 09/23/20 11:11 AM  Result Value Ref Range   RPR Ser Ql NON REACTIVE NON REACTIVE    Comment: Performed at [facility] [address], [place], [place] [address]  Hepatitis panel, acute     Status: None   Collection Time: 09/23/20 11:11 AM  Result Value Ref Range   Hepatitis B Surface Ag NON REACTIVE NON REACTIVE   HCV Ab NON REACTIVE NON REACTIVE    Comment: (NOTE) Nonreactive HCV antibody screen is consistent with no HCV infections,  unless recent infection is suspected or other evidence exists to indicate HCV infection.     Hep A IgM NON REACTIVE NON REACTIVE   Hep B C IgM NON REACTIVE NON REACTIVE    Comment: Performed at [facility], [place] [address] [place], [place] [address]  Glucose, capillary     Status: Abnormal   Collection Time: 09/23/20 12:58 PM  Result Value Ref Range   Glucose-Capillary 151 (H) 70 - 99 mg/dL    Comment: Glucose reference range applies only to samples taken after fasting for at least 8 hours.  Glucose, capillary     Status: Abnormal   Collection Time: 09/23/20  4:56 PM  Result Value Ref Range   Glucose-Capillary 148 (H) 70 - 99 mg/dL    Comment: Glucose reference range applies only to samples taken after fasting for at least 8 hours.  Glucose, capillary     Status: Abnormal   Collection Time: 09/23/20  8:07 PM  Result Value Ref Range   Glucose-Capillary 125 (H) 70 - 99 mg/dL    Comment: Glucose reference range applies only to samples taken after fasting for at least 8 hours.  CBC with Differential/Platelet     Status: Abnormal   Collection Time: 09/24/20  6:06 AM  Result  Value Ref Range   WBC 5.2 4.0 - 10.5 K/uL   RBC 3.57 (L) 4.22 - 5.81 MIL/uL   Hemoglobin 8.5 (L) 13.0 - 17.0 g/dL   HCT 27.9 (L) 39.0 - 52.0 %   MCV 78.2 (L) 80.0 - 100.0 fL   MCH 23.8 (L) 26.0 - 34.0 pg   MCHC 30.5 30.0 - 36.0 g/dL   RDW 17.4 (H) 11.5 - 15.5 %   Platelets 323 150 - 400 K/uL   nRBC 0.0 0.0 - 0.2 %   Neutrophils Relative % 72 %   Neutro Abs 3.7 1.7 - 7.7 K/uL   Lymphocytes Relative 24 %   Lymphs Abs 1.2 0.7 - 4.0 K/uL   Monocytes Relative 2 %   Monocytes Absolute 0.1 0.1 - 1.0 K/uL   Eosinophils Relative 0 %   Eosinophils Absolute 0.0 0.0 - 0.5 K/uL   Basophils Relative 0 %   Basophils Absolute 0.0 0.0 - 0.1 K/uL   Immature Granulocytes 2 %   Abs Immature Granulocytes 0.10 (H) 0.00 - 0.07 K/uL    Comment: Performed at [facility], [address] [place], [place] [address]  Basic metabolic panel     Status: Abnormal   Collection Time: 09/24/20  6:06 AM  Result Value Ref Range   Sodium 138 135 - 145 mmol/L   Potassium 4.2 3.5 - 5.1 mmol/L   Chloride 105 98 - 111 mmol/L  CO2 25 22 - 32 mmol/L   Glucose, Bld 199 (H) 70 - 99 mg/dL    Comment: Glucose reference range applies only to samples taken after fasting for at least 8 hours.   BUN 16 8 - 23 mg/dL   Creatinine, Ser 0.78 0.61 - 1.24 mg/dL   Calcium 8.2 (L) 8.9 - 10.3 mg/dL   GFR, Estimated >60 >60 mL/min    Comment: (NOTE) Calculated using the CKD-EPI Creatinine Equation (2021)    Anion gap 8 5 - 15    Comment: Performed at [facility], [address], [place], [place] [address]  Magnesium     Status: None   Collection Time: 09/24/20  6:06 AM  Result Value Ref Range   Magnesium 2.3 1.7 - 2.4 mg/dL    Comment: Performed at [facility], [address] [place], [place] [address]  Glucose, capillary     Status: Abnormal   Collection Time: 09/24/20  7:53 AM  Result Value Ref Range   Glucose-Capillary 198 (H) 70 - 99 mg/dL    Comment: Glucose reference range applies only  to samples taken after fasting for at least 8 hours.  Glucose, capillary     Status: Abnormal   Collection Time: 09/24/20 11:59 AM  Result Value Ref Range   Glucose-Capillary 284 (H) 70 - 99 mg/dL    Comment: Glucose reference range applies only to samples taken after fasting for at least 8 hours.  Glucose, capillary     Status: Abnormal   Collection Time: 09/24/20  4:30 PM  Result Value Ref Range   Glucose-Capillary 277 (H) 70 - 99 mg/dL    Comment: Glucose reference range applies only to samples taken after fasting for at least 8 hours.    Current Facility-Administered Medications  Medication Dose Route Frequency Provider Last Rate Last Admin  . 0.9 %  sodium chloride infusion   Intravenous PRN Swayze, Ava, DO 10 mL/hr at 09/24/20 0016 250 mL at 09/24/20 0016  . acetaminophen (TYLENOL) tablet 650 mg  650 mg Oral Q6H PRN Lenore Cordia, MD   650 mg at 09/23/20 1559   Or  . acetaminophen (TYLENOL) suppository 650 mg  650 mg Rectal Q6H PRN Lenore Cordia, MD      . Chlorhexidine Gluconate Cloth 2 % PADS 6 each  6 each Topical Daily Swayze, Ava, DO   6 each at 09/24/20 1045  . dexamethasone (DECADRON) injection 4 mg  4 mg Intravenous Q6H Sharen Hones, MD   4 mg at 09/24/20 1245  . enoxaparin (LOVENOX) injection 40 mg  40 mg Subcutaneous Q24H Zada Finders R, MD   40 mg at 09/24/20 1244  . feeding supplement (ENSURE ENLIVE / ENSURE PLUS) liquid 237 mL  237 mL Oral BID BM Sharen Hones, MD   237 mL at 09/24/20 1421  . insulin aspart (novoLOG) injection 0-9 Units  0-9 Units Subcutaneous TID WC Lenore Cordia, MD   5 Units at 09/24/20 1245  . ondansetron (ZOFRAN) tablet 4 mg  4 mg Oral Q6H PRN Lenore Cordia, MD       Or  . ondansetron (ZOFRAN) injection 4 mg  4 mg Intravenous Q6H PRN Zada Finders R, MD      . vancomycin (VANCOREADY) IVPB 500 mg/100 mL  500 mg Intravenous Q12H Swayze, Ava, DO 100 mL/hr at 09/24/20 1040 500 mg at 09/24/20 1040    Musculoskeletal: Strength & Muscle Tone:  decreased Gait & Station: unsteady Patient leans: N/A  Psychiatric Specialty Exam:  Presentation  General Appearance: No data recorded Eye Contact:No data recorded Speech:No data recorded Speech Volume:No data recorded Handedness:No data recorded  Mood and Affect  Mood:No data recorded Affect:No data recorded  Thought Process  Thought Processes:No data recorded Descriptions of Associations:No data recorded Orientation:No data recorded Thought Content:No data recorded History of Schizophrenia/Schizoaffective disorder:No data recorded Duration of Psychotic Symptoms:No data recorded Hallucinations:No data recorded Ideas of Reference:No data recorded Suicidal Thoughts:No data recorded Homicidal Thoughts:No data recorded  Sensorium  Memory:No data recorded Judgment:No data recorded Insight:No data recorded  Executive Functions  Concentration:No data recorded Attention Span:No data recorded Recall:No data recorded Fund of Knowledge:No data recorded Language:No data recorded  Psychomotor Activity  Psychomotor Activity:No data recorded  Assets  Assets:No data recorded  Sleep  Sleep:No data recorded  Physical Exam: Physical Exam Vitals and nursing note reviewed.  Constitutional:      Appearance: Normal appearance. He is ill-appearing.  HENT:     Head: Normocephalic and atraumatic.     Mouth/Throat:     Pharynx: Oropharynx is clear.  Eyes:     Pupils: Pupils are equal, round, and reactive to light.  Cardiovascular:     Rate and Rhythm: Normal rate and regular rhythm.  Pulmonary:     Effort: Pulmonary effort is normal.     Breath sounds: Normal breath sounds.  Abdominal:     General: Abdomen is flat.     Palpations: Abdomen is soft.  Musculoskeletal:        General: Normal range of motion.  Skin:    General: Skin is warm and dry.  Neurological:     General: No focal deficit present.     Mental Status: He is alert. Mental status is at  baseline.  Psychiatric:        Attention and Perception: He is inattentive.        Mood and Affect: Affect is labile.        Speech: Speech is tangential.        Behavior: Behavior is agitated. Behavior is not aggressive.        Thought Content: Thought content does not include homicidal or suicidal ideation.        Cognition and Memory: Cognition is impaired. Memory is impaired.        Judgment: Judgment is impulsive.    Review of Systems  Constitutional: Negative.   HENT: Negative.   Eyes: Negative.   Respiratory: Negative.   Cardiovascular: Negative.   Gastrointestinal: Negative.   Musculoskeletal: Positive for back pain.  Skin: Negative.   Neurological: Negative.   Psychiatric/Behavioral: Positive for memory loss. The patient is nervous/anxious.    Blood pressure 119/72, pulse 89, temperature 98.6 F (37 C), resp. rate 17, height 5\' 11"  (1.803 m), weight 68 kg, SpO2 98 %. Body mass index is 20.92 kg/m.  Treatment Plan Summary: Plan This is a 80 year old man with confusion who was septic.  Last time I saw him there was a question about him consenting to a transesophageal echocardiogram.  After I had thought that he had capacity he displayed confusion with the cardiologist and no longer appeared to be able to make reasonable decisions.  Seeing him today the explanation is a little clearer.  He probably is having waxing and waning rapidly changing mental status periods during which he might be able to appear lucid and thoughtful for a few minutes and then suddenly become confused again.  I would change my assessment at this point that  I think it is better to assume that the patient does not have capacity.  Very much needs to have a confirmed reliable family member or someone engaged to make treatment decisions and end-of-life decisions for him.  I am going to make sure there is some orders for as needed IV antipsychotics in case the agitation is getting out of hand.  Disposition:  Supportive therapy provided about ongoing stressors.  Alethia Berthold, MD 09/24/2020 5:08 PM

## 2020-09-24 NOTE — Progress Notes (Signed)
ID Patient is awake and alert but confused and thinks he was going to be sent to [place] by airplane. Also says his daughter will come and pick him up    Patient Vitals for the past 24 hrs:  BP Temp Temp src Pulse Resp SpO2  09/24/20 2038 123/77 97.7 F (36.5 C) Oral 75 18 100 %  09/24/20 1532 119/72 98.6 F (37 C) -- 89 17 98 %  09/24/20 1124 113/76 97.8 F (36.6 C) -- 79 15 99 %  09/24/20 0757 129/87 98.6 F (37 C) -- 62 15 99 %  09/24/20 0454 123/66 (!) 97.5 F (36.4 C) -- 66 18 100 %  09/24/20 0130 (!) 116/58 97.8 F (36.6 C) -- (!) 55 18 100 %  .  Awake and alert Chest bilateral air entry Heart sound S1-S2 Abdomen soft CNS nonfocal  CBC Latest Ref Rng & Units 09/24/2020 09/23/2020 09/22/2020  WBC 4.0 - 10.5 K/uL 5.2 10.2 11.5(H)  Hemoglobin 13.0 - 17.0 g/dL 8.5(L) 8.3(L) 7.7(L)  Hematocrit 39.0 - 52.0 % 27.9(L) 26.6(L) 26.1(L)  Platelets 150 - 400 K/uL 323 333 296    CMP Latest Ref Rng & Units 09/24/2020 09/23/2020 09/22/2020  Glucose 70 - 99 mg/dL 199(H) 100(H) 109(H)  BUN 8 - 23 mg/dL 16 10 11   Creatinine 0.61 - 1.24 mg/dL 0.78 0.73 0.74  Sodium 135 - 145 mmol/L 138 135 138  Potassium 3.5 - 5.1 mmol/L 4.2 4.1 3.3(L)  Chloride 98 - 111 mmol/L 105 103 103  CO2 22 - 32 mmol/L 25 26 27   Calcium 8.9 - 10.3 mg/dL 8.2(L) 8.0(L) 7.9(L)  Total Protein 6.5 - 8.1 g/dL - - -  Total Bilirubin 0.3 - 1.2 mg/dL - - -  Alkaline Phos 38 - 126 U/L - - -  AST 15 - 41 U/L - - -  ALT 0 - 44 U/L - - -    Impression/recommendation  MRSA bacteremia on vancomycin TEE pending. If unable to get TEE then will empirically treat him with 4 weeks of IV antibiotics.  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  Received iron  Severe  protein calorie malnutrition.  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  Discussed the management with the care team.

## 2020-09-24 NOTE — Progress Notes (Addendum)
   No plan for TEE today 09/24/20. Spoke with NM to confirm delay. Will continue to follow.  Signed, Arvil Chaco, PA-C 09/24/2020, 11:09 AM

## 2020-09-25 ENCOUNTER — Inpatient Hospital Stay: Payer: Medicare (Managed Care)

## 2020-09-25 DIAGNOSIS — R918 Other nonspecific abnormal finding of lung field: Secondary | ICD-10-CM | POA: Diagnosis not present

## 2020-09-25 DIAGNOSIS — C7931 Secondary malignant neoplasm of brain: Secondary | ICD-10-CM

## 2020-09-25 DIAGNOSIS — R4182 Altered mental status, unspecified: Secondary | ICD-10-CM | POA: Diagnosis not present

## 2020-09-25 DIAGNOSIS — G9341 Metabolic encephalopathy: Secondary | ICD-10-CM | POA: Diagnosis not present

## 2020-09-25 DIAGNOSIS — R7881 Bacteremia: Secondary | ICD-10-CM | POA: Diagnosis not present

## 2020-09-25 DIAGNOSIS — B9562 Methicillin resistant Staphylococcus aureus infection as the cause of diseases classified elsewhere: Secondary | ICD-10-CM | POA: Diagnosis not present

## 2020-09-25 DIAGNOSIS — E871 Hypo-osmolality and hyponatremia: Secondary | ICD-10-CM | POA: Diagnosis not present

## 2020-09-25 DIAGNOSIS — C801 Malignant (primary) neoplasm, unspecified: Secondary | ICD-10-CM

## 2020-09-25 LAB — CBC WITH DIFFERENTIAL/PLATELET
Abs Immature Granulocytes: 0.18 10*3/uL — ABNORMAL HIGH (ref 0.00–0.07)
Basophils Absolute: 0 10*3/uL (ref 0.0–0.1)
Basophils Relative: 0 %
Eosinophils Absolute: 0 10*3/uL (ref 0.0–0.5)
Eosinophils Relative: 0 %
HCT: 26.7 % — ABNORMAL LOW (ref 39.0–52.0)
Hemoglobin: 8.2 g/dL — ABNORMAL LOW (ref 13.0–17.0)
Immature Granulocytes: 2 %
Lymphocytes Relative: 14 %
Lymphs Abs: 1.4 10*3/uL (ref 0.7–4.0)
MCH: 23.8 pg — ABNORMAL LOW (ref 26.0–34.0)
MCHC: 30.7 g/dL (ref 30.0–36.0)
MCV: 77.4 fL — ABNORMAL LOW (ref 80.0–100.0)
Monocytes Absolute: 0.3 10*3/uL (ref 0.1–1.0)
Monocytes Relative: 3 %
Neutro Abs: 8.1 10*3/uL — ABNORMAL HIGH (ref 1.7–7.7)
Neutrophils Relative %: 81 %
Platelets: 381 10*3/uL (ref 150–400)
RBC: 3.45 MIL/uL — ABNORMAL LOW (ref 4.22–5.81)
RDW: 17.5 % — ABNORMAL HIGH (ref 11.5–15.5)
WBC: 10 10*3/uL (ref 4.0–10.5)
nRBC: 0 % (ref 0.0–0.2)

## 2020-09-25 LAB — BASIC METABOLIC PANEL
Anion gap: 6 (ref 5–15)
BUN: 23 mg/dL (ref 8–23)
CO2: 25 mmol/L (ref 22–32)
Calcium: 8.1 mg/dL — ABNORMAL LOW (ref 8.9–10.3)
Chloride: 100 mmol/L (ref 98–111)
Creatinine, Ser: 0.76 mg/dL (ref 0.61–1.24)
GFR, Estimated: >60 mL/min (ref >60)
Glucose, Bld: 237 mg/dL — ABNORMAL HIGH (ref 70–99)
Potassium: 4.7 mmol/L (ref 3.5–5.1)
Sodium: 131 mmol/L — ABNORMAL LOW (ref 135–145)

## 2020-09-25 LAB — GLUCOSE, CAPILLARY
Glucose-Capillary: 209 mg/dL — ABNORMAL HIGH (ref 70–99)
Glucose-Capillary: 145 mg/dL — ABNORMAL HIGH (ref 70–99)
Glucose-Capillary: 225 mg/dL — ABNORMAL HIGH (ref 70–99)
Glucose-Capillary: 251 mg/dL — ABNORMAL HIGH (ref 70–99)

## 2020-09-25 LAB — CEA: CEA: 2.7 ng/mL (ref 0.0–4.7)

## 2020-09-25 LAB — VANCOMYCIN, PEAK: Vancomycin Pk: 19 ug/mL — ABNORMAL LOW (ref 30–40)

## 2020-09-25 LAB — VANCOMYCIN, TROUGH: Vancomycin Tr: 12 ug/mL — ABNORMAL LOW (ref 15–20)

## 2020-09-25 MED ORDER — FENTANYL CITRATE (PF) 100 MCG/2ML IJ SOLN
INTRAMUSCULAR | Status: AC
  Filled 2020-09-25: qty 2

## 2020-09-25 MED ORDER — MIDAZOLAM HCL 2 MG/2ML IJ SOLN
INTRAMUSCULAR | Status: AC | PRN
  Administered 2020-09-25 (×2): 0.5 mg via INTRAVENOUS

## 2020-09-25 MED ORDER — FENTANYL CITRATE (PF) 100 MCG/2ML IJ SOLN
INTRAMUSCULAR | Status: AC | PRN
  Administered 2020-09-25 (×2): 25 ug via INTRAVENOUS

## 2020-09-25 MED ORDER — MIDAZOLAM HCL 2 MG/2ML IJ SOLN
INTRAMUSCULAR | Status: AC
  Filled 2020-09-25: qty 2

## 2020-09-25 MED ORDER — VANCOMYCIN HCL 1250 MG/250ML IV SOLN
1250.0000 mg | INTRAVENOUS | Status: DC
  Administered 2020-09-25 – 2020-09-28 (×4): 1250 mg via INTRAVENOUS
  Filled 2020-09-25 (×5): qty 250

## 2020-09-25 MED ORDER — SODIUM CHLORIDE 0.9 % IV SOLN
INTRAVENOUS | Status: AC | PRN
  Administered 2020-09-25: 10 mL/h via INTRAVENOUS

## 2020-09-25 MED ORDER — ENOXAPARIN SODIUM 40 MG/0.4ML ~~LOC~~ SOLN
40.0000 mg | SUBCUTANEOUS | Status: DC
  Administered 2020-09-25 – 2020-09-29 (×5): 40 mg via SUBCUTANEOUS
  Filled 2020-09-25 (×5): qty 0.4

## 2020-09-25 NOTE — Procedures (Signed)
Interventional Radiology Procedure Note  Procedure: Biopsy of right inguinal lymph node  Indication: Enlarged right inguinal lymph node  Findings: Please refer to procedural dictation for full description.  Complications: None  EBL: < 10 mL  Miachel Roux, MD [phone]
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
## 2020-09-25 NOTE — Consult Note (Signed)
Pharmacy Antibiotic Note  Rick Marquez is a 80 y.o. male admitted on 09/16/2020 with sepsis. Pt brought in altered and confused from [facility]. Pt is from [place] with incomplete PMH. PMH includes prostate cancer, diabetes and hypertension, unknown home medications. Recent UTI per pt, taking AZO. Pt has chronic foley cath, not replaced since admission. Pharmacy has been consulted for vancomycin dosing.  Day #9 vancomycin  No TEE as patient unable to consent to procedure  TTE 3/4 negative to vegetation  Renal: SCr stable  WBC 10  For biopsy today as MRI brain revealed brain mets,  CT abdomen - adenopathy  Vancomycin levels   Vancomycin dose 3/10 at 23:28 on 500mg  IV q12h  Vanco peak = 19 at 01:42 3/11  vanco trough = 12 at 10:50 3/11  AUC = 373.5, True Cmax = 20.2, Cmin = 11.6 T1/2 = 13.8h  Plan:  Based on vancomycin levels, adjust vancomycin to 1250mg  IV q24h for AUC = 467  (peak = 32, trough = 10.4)  Follow renal function  Recheck vancomycin levels in ~5days (sooner if change in renal function)   Height: 5\' 11"  (180.3 cm) Weight: 68 kg (150 lb) IBW/kg (Calculated) : 75.3  Temp (24hrs), Avg:98.1 F (36.7 C), Min:97.3 F (36.3 C), Max:98.6 F (37 C)  Recent Labs  Lab 09/19/20 1228 09/19/20 2124 09/20/20 0445 09/21/20 0459 09/22/20 0701 09/23/20 0534 09/24/20 0606 09/25/20 0142 09/25/20 1050  WBC  --   --  9.3  --  11.5* 10.2 5.2 10.0  --   CREATININE  --   --  0.72 0.71 0.74 0.73 0.78 0.76  --   VANCOTROUGH  --  17  --   --   --   --   --   --  12*  VANCOPEAK 40  --   --   --   --   --   --  19*  --     Estimated Creatinine Clearance: 73.2 mL/min (by C-G formula based on SCr of 0.76 mg/dL).    Not on File  Antimicrobials this admission: 3/2 ceftriaxone >> 3/6 3/3 vancomycin >>  Microbiology results: 3/2 BCx: 2/4 bottles, 1/2 sets MRSA 3/2 UCx: multiple species 3/4 Bcx NG  Thank you for allowing pharmacy to be a part of this patient's  care.  Doreene Eland, PharmD, BCPS.   Work Cell: [phone] 09/25/2020 1:58 PM  \

## 2020-09-25 NOTE — Progress Notes (Addendum)
CSW left VM for [facility] [phone]) requesting a return call regarding possible short term placement for patient.  Outreached to [place] regarding options for transport to [place] when a discharge plan is made and patient is medically stable for discharge.  [name], [place]

## 2020-09-25 NOTE — OR Nursing (Signed)
Rad nurse to recover pt in inpt room. Report called to Inpt nurse Carmin Richmond. Pt received versed 1 mg and fentanyl 50 mcg IV. bandaid right groin dry and intact

## 2020-09-25 NOTE — Progress Notes (Signed)
Inpatient Diabetes Program Recommendations  AACE/ADA: New Consensus Statement on Inpatient Glycemic Control (2015)  Target Ranges:  Prepandial:   less than 140 mg/dL      Peak postprandial:   less than 180 mg/dL (1-2 hours)      Critically ill patients:  140 - 180 mg/dL   Results for Rick Marquez, Rick Marquez (MRN [id number]) as of 09/25/2020 09:38  Ref. Range 09/24/2020 07:53 09/24/2020 11:59 09/24/2020 16:30 09/24/2020 20:42  Glucose-Capillary Latest Ref Range: 70 - 99 mg/dL 198 (H)  2 units NOVOLOG  284 (H)  5 units NOVOLOG  277 (H)  5 units NOVOLOG  196 (H)   Results for Rick Marquez, Rick Marquez (MRN [id number]) as of 09/25/2020 09:38  Ref. 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  CBGs >200  Please consider:  1. Starting Lantus 5 units Daily  (0.1 units/kg)  2. Increase frequency of Novolog SSi to Q4 hours     --Will follow patient during hospitalization--  Wyn Quaker RN, MSN, CDE Diabetes Coordinator Inpatient Glycemic Control Team Team Pager: [phone] (8a-5p)

## 2020-09-25 NOTE — Progress Notes (Signed)
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  Notably, CT shows left pelvic adenopathy causing left ureteral obstruction.  He underwent lymph node biopsy with Dr. Dwaine Gale for further evaluation earlier today. Creatinine remains stable, today 0.76. Patient's mental status has cleared since he was last seen by our team.  He denies flank pain.  Today he reports being diagnosed with prostate cancer approximately 2 to 3 years ago in [place].  He states this was treated with injections every 3 months, likely ADT.  He states he has had a chronic indwelling Foley catheter since around that time.  He has consistently stated that his urologist is Dr. Lilia Pro in [place], [place], though a Google search does not reveal a provider by that name in that location.  He states he wishes to transfer his urologic care to [place], where he plans to live upon discharge.  Anti-infectives: Anti-infectives (From admission, onward)   Start     Dose/Rate Route Frequency Ordered Stop   09/25/20 2200  vancomycin (VANCOREADY) IVPB 1250 mg/250 mL        1,250 mg 166.7 mL/hr over 90 Minutes Intravenous Every 24 hours 09/25/20 1402     09/20/20 1100  vancomycin (VANCOREADY) IVPB 500 mg/100 mL        500 mg 100 mL/hr over 60 Minutes Intravenous Every 12 hours 09/20/20 0209 09/25/20 1600   09/17/20 2200  vancomycin (VANCOREADY) IVPB 750 mg/150 mL  Status:  Discontinued       "Followed by" Linked Group Details   750 mg 150 mL/hr over 60 Minutes Intravenous Every 12 hours 09/17/20 0955 09/20/20 0208   09/17/20 1030  vancomycin (VANCOREADY) IVPB 1500 mg/300 mL       "Followed by" Linked Group Details   1,500 mg 150 mL/hr over 120 Minutes Intravenous STAT 09/17/20 0955 09/17/20 1145   09/16/20 2045  cefTRIAXone (ROCEPHIN) 1 g in sodium chloride 0.9 % 100 mL  IVPB  Status:  Discontinued        1 g 200 mL/hr over 30 Minutes Intravenous Every 24 hours 09/16/20 2033 09/21/20 1516      Current Facility-Administered Medications  Medication Dose Route Frequency Provider Last Rate Last Admin  . 0.9 %  sodium chloride infusion   Intravenous PRN Swayze, Ava, DO 10 mL/hr at 09/25/20 1358 250 mL at 09/25/20 1358  . acetaminophen (TYLENOL) tablet 650 mg  650 mg Oral Q6H PRN Lenore Cordia, MD   650 mg at 09/23/20 1559   Or  . acetaminophen (TYLENOL) suppository 650 mg  650 mg Rectal Q6H PRN Lenore Cordia, MD      . Chlorhexidine Gluconate Cloth 2 % PADS 6 each  6 each Topical Daily Swayze, Ava, DO   6 each at 09/25/20 1031  . dexamethasone (DECADRON) injection 4 mg  4 mg Intravenous Q6H Sharen Hones, MD   4 mg at 09/25/20 1354  . enoxaparin (LOVENOX) injection 40 mg  40 mg Subcutaneous Q24H Lorna Dibble, RPH      . feeding supplement (ENSURE ENLIVE / ENSURE PLUS) liquid 237 mL  237 mL Oral BID BM Sharen Hones, MD   237 mL at 09/24/20 1421  . fentaNYL (SUBLIMAZE) 100 MCG/2ML injection           . haloperidol lactate (HALDOL) injection 1 mg  1 mg Intravenous Q6H PRN  Clapacs, John T, MD      . insulin aspart (novoLOG) injection 0-9 Units  0-9 Units Subcutaneous TID WC Lenore Cordia, MD   1 Units at 09/25/20 1353  . midazolam (VERSED) 2 MG/2ML injection           . ondansetron (ZOFRAN) tablet 4 mg  4 mg Oral Q6H PRN Lenore Cordia, MD       Or  . ondansetron (ZOFRAN) injection 4 mg  4 mg Intravenous Q6H PRN Zada Finders R, MD      . vancomycin (VANCOREADY) IVPB 1250 mg/250 mL  1,250 mg Intravenous Q24H Berton Mount, RPH      . vancomycin (VANCOREADY) IVPB 500 mg/100 mL  500 mg Intravenous Q12H Berton Mount, RPH 100 mL/hr at 09/25/20 1400 500 mg at 09/25/20 1400   Objective: Vital signs in last 24 hours: Temp:  [97.3 F (36.3 C)-98.6 F (37 C)] 98.4 F (36.9 C) (03/11 1147) Pulse Rate:  [56-89] 57 (03/11 1147) Resp:  [14-23] 15 (03/11  1147) BP: (105-146)/(59-77) 129/59 (03/11 1147) SpO2:  [96 %-100 %] 99 % (03/11 1147)  Intake/Output from previous day: 03/10 0701 - 03/11 0700 In: -  Out: 1400 [Urine:1400] Intake/Output this shift: No intake/output data recorded.  Physical Exam Vitals and nursing note reviewed.  Constitutional:      General: He is not in acute distress.    Appearance: He is not ill-appearing, toxic-appearing or diaphoretic.  HENT:     Head: Normocephalic and atraumatic.  Pulmonary:     Effort: Pulmonary effort is normal. No respiratory distress.  Skin:    General: Skin is warm and dry.  Neurological:     Mental Status: He is alert.  Psychiatric:        Mood and Affect: Mood normal.        Behavior: Behavior normal.    Lab Results:  Recent Labs    09/24/20 0606 09/25/20 0142  WBC 5.2 10.0  HGB 8.5* 8.2*  HCT 27.9* 26.7*  PLT 323 381   BMET Recent Labs    09/24/20 0606 09/25/20 0142  NA 138 131*  K 4.2 4.7  CL 105 100  CO2 25 25  GLUCOSE 199* 237*  BUN 16 23  CREATININE 0.78 0.76  CALCIUM 8.2* 8.1*   Assessment & Plan: 80 year old male with PMH prostate cancer, chronic indwelling Foley catheter, metastatic cancer with unclear primary, and pelvic adenopathy causing distal left ureteral obstruction with secondary left hydroureteronephrosis.   Despite his new metastatic cancer diagnosis, I still recommend conservative management for his left hydroureteronephrosis in the absence of flank pain and with stable renal function.  He will require outpatient follow-up with urology for management of his hydroureteronephrosis, chronic indwelling Foley catheter, and history of prostate cancer.  Unclear if patient remains confused regarding his prior urologist or if this provider has retired or moved.  Recommend working with patient's daughter-in-law to obtain past medical records in order to establish care in [place] upon discharge.  Recommendations: -Continue to monitor renal  function.  Contact urology with new flank pain, rising creatinine, or concerns for urinary infection.  May intervene with left ureteral stent versus left PCN at that time.  Debroah Loop, PA-C 09/25/2020
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
## 2020-09-26 DIAGNOSIS — R410 Disorientation, unspecified: Secondary | ICD-10-CM | POA: Diagnosis not present

## 2020-09-26 DIAGNOSIS — A4102 Sepsis due to Methicillin resistant Staphylococcus aureus: Secondary | ICD-10-CM | POA: Diagnosis not present

## 2020-09-26 DIAGNOSIS — N390 Urinary tract infection, site not specified: Secondary | ICD-10-CM | POA: Diagnosis not present

## 2020-09-26 DIAGNOSIS — C7931 Secondary malignant neoplasm of brain: Secondary | ICD-10-CM | POA: Diagnosis not present

## 2020-09-26 LAB — GLUCOSE, CAPILLARY
Glucose-Capillary: 193 mg/dL — ABNORMAL HIGH (ref 70–99)
Glucose-Capillary: 219 mg/dL — ABNORMAL HIGH (ref 70–99)
Glucose-Capillary: 288 mg/dL — ABNORMAL HIGH (ref 70–99)
Glucose-Capillary: 260 mg/dL — ABNORMAL HIGH (ref 70–99)

## 2020-09-26 LAB — MAGNESIUM: Magnesium: 2 mg/dL (ref 1.7–2.4)

## 2020-09-26 LAB — BASIC METABOLIC PANEL
Anion gap: 8 (ref 5–15)
BUN: 24 mg/dL — ABNORMAL HIGH (ref 8–23)
CO2: 24 mmol/L (ref 22–32)
Calcium: 8.1 mg/dL — ABNORMAL LOW (ref 8.9–10.3)
Chloride: 100 mmol/L (ref 98–111)
Creatinine, Ser: 0.69 mg/dL (ref 0.61–1.24)
GFR, Estimated: >60 mL/min (ref >60)
Glucose, Bld: 163 mg/dL — ABNORMAL HIGH (ref 70–99)
Potassium: 4.3 mmol/L (ref 3.5–5.1)
Sodium: 132 mmol/L — ABNORMAL LOW (ref 135–145)

## 2020-09-26 MED ORDER — QUETIAPINE FUMARATE 25 MG PO TABS
25.0000 mg | ORAL_TABLET | Freq: Every day | ORAL | Status: DC
  Administered 2020-09-26 – 2020-09-29 (×4): 25 mg via ORAL
  Filled 2020-09-26 (×4): qty 1

## 2020-09-26 MED ORDER — DEXAMETHASONE SODIUM PHOSPHATE 4 MG/ML IJ SOLN
4.0000 mg | Freq: Three times a day (TID) | INTRAMUSCULAR | Status: DC
  Administered 2020-09-26 – 2020-09-27 (×3): 4 mg via INTRAVENOUS
  Filled 2020-09-26 (×4): qty 1

## 2020-09-26 NOTE — Progress Notes (Signed)
PROGRESS NOTE    DAMIR LEUNG  VQX:[id number] DOB: [birth year] DOA: 09/16/2020 PCP: Patient, No Pcp Per   Chief complaint: altered mental status. Brief Narrative:  Rick Marquez Crossis a 80 y.o.malewithunknown medical history who presents to the ED for evaluation of confusion. History is limited from patient due to confusion.In the emergency room, he met sepsis criteria with tachycardia, tachypnea and leukocytosis. His blood culture was positive for MRSA. He is covered with vancomycin. Patient has some lower back pain, MRI of the lumbar spine did not show evidence of discitis. 3/10.MRI of the brain with contrast showed multiple metastatic lesions. CT scan chest/abdomen/pelvis showed mass in the lungs, abdomen. Oncology consult obtained. 3/11.  Biopsy of right inguinal lymph node performed.    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  Sepsis with MRSA septicemia Source of infection still unclear, attempting to be scheduled for TEE on Monday.  Continue vancomycin, appreciate ID consult  2.  Delirium with acute metabolic encephalopathy. 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 Pelvic mass with subsequent hydronephrosis. Status postop biopsy, still on Decadron for premeds, decrease dose to every 8 hours. Still no intervention for hydronephrosis. Status post lymph node biopsy.  Appreciate oncology consult. Patient mental status is improving, patient still has significant insomnia, added Seroquel 25 mg every evening.  3.  Iron deficient anemia. Status post IV iron, continue to follow.   4.  Urinary tract infection. Antibiotic  completed.     DVT prophylaxis: Lovenox Code Status: Full Family Communication:  Disposition Plan:  .   Status is: Inpatient  Remains inpatient appropriate because:Inpatient level of care appropriate due to severity of illness   Dispo: The patient is from: Home              Anticipated d/c is to: SNF              Patient currently is not medically stable to d/c.   Difficult to place patient No        I/O last 3 completed shifts: In: 398.2 [I.V.:398.2] Out: 3300 [Urine:3300] No intake/output data recorded.     Consultants:   ID, Oncology, cardiology  Procedures:   Antimicrobials:  Vancomycin.  Subjective: Patient states that he could not sleep last night, has some confusion this morning.  Does not have any headaches. Denies any short of breath or cough. No abdominal pain or nausea vomiting. No dysuria or hematuria.  Objective: Vitals:   09/25/20 1527 09/25/20 2050 09/26/20 0520 09/26/20 0804  BP: 124/65 (!) 142/70 129/61 132/63  Pulse: 62 63 60 63  Resp: _0 Temp: 98.5 F (36.9 C) (!) 97.4 F (36.3 C) 98.1 F (36.7 C) 97.7 F (36.5 C)  TempSrc:  Oral Oral   SpO2: 97% 98% 98% 98%  Weight:      Height:        Intake/Output Summary (Last 24 hours) at 09/26/2020 0918 Last data filed at 09/26/2020 0520 Gross per 24 hour  Intake 398.19 ml  Output 1900 ml  Net -1501.81 ml   Filed Weights   09/16/20 1940  Weight: 68 kg  Examination:  General exam: Appears calm and comfortable, appears severely malnourished Respiratory system: Clear to auscultation. Respiratory effort normal. Cardiovascular system: S1 & S2 heard, RRR. No JVD, murmurs, rubs, gallops or clicks. No pedal edema. Gastrointestinal system: Abdomen is nondistended, soft and nontender. No organomegaly or masses felt. Normal bowel sounds heard. Central nervous system: Alert and oriented x2. No focal neurological deficits. Extremities: Symmetric 5 x 5 power. Skin: No rashes,  lesions or ulcers Psychiatry: Mood & affect appropriate.     Data Reviewed: I have personally reviewed following labs and imaging studies  CBC: Recent Labs  Lab 09/20/20 0445 09/22/20 0701 09/23/20 0534 09/24/20 0606 09/25/20 0142  WBC 9.3 11.5* 10.2 5.2 10.0  NEUTROABS 7.0 8.9* 7.7 3.7 8.1*  HGB 7.9* 7.7* 8.3* 8.5* 8.2*  HCT 26.7* 26.1* 26.6* 27.9* 26.7*  MCV 79.0* 78.6* 76.7* 78.2* 77.4*  PLT 273 296 333 323 850   Basic Metabolic Panel: Recent Labs  Lab 09/22/20 0701 09/23/20 0534 09/24/20 0606 09/25/20 0142 09/26/20 0431  NA 138 135 138 131* 132*  K 3.3* 4.1 4.2 4.7 4.3  CL 103 103 105 100 100  CO2 _0 GLUCOSE 109* 100* 199* 237* 163*  BUN _1 24*  CREATININE 0.74 0.73 0.78 0.76 0.69  CALCIUM 7.9* 8.0* 8.2* 8.1* 8.1*  MG 2.0 1.9 2.3  --  2.0   GFR: Estimated Creatinine Clearance: 73.2 mL/min (by C-G formula based on SCr of 0.69 mg/dL). Liver Function Tests: Recent Labs  Lab 09/20/20 0445  AST 23  ALT 13  ALKPHOS 169*  BILITOT 0.6  PROT 6.4*  ALBUMIN 2.0*   No results for input(s): LIPASE, AMYLASE in the last 168 hours. No results for input(s): AMMONIA in the last 168 hours. Coagulation Profile: No results for input(s): INR, PROTIME in the last 168 hours. Cardiac Enzymes: No results for input(s): CKTOTAL, CKMB, CKMBINDEX, TROPONINI in the last 168 hours. BNP (last 3 results) No results for input(s): PROBNP in the last 8760 hours. HbA1C: No results for input(s): HGBA1C in the last 72 hours. CBG: Recent Labs  Lab 09/25/20 0756 09/25/20 1151 09/25/20 1615 09/25/20 2052 09/26/20 0809  GLUCAP 209* 145* 225* 251* 193*   Lipid Profile: No results for input(s): CHOL, HDL, LDLCALC, TRIG, CHOLHDL, LDLDIRECT in the last 72 hours. Thyroid Function Tests: No results for input(s): TSH, T4TOTAL, FREET4, T3FREE, THYROIDAB in the last 72 hours. Anemia Panel: No results for input(s): VITAMINB12, FOLATE, FERRITIN, TIBC, IRON, RETICCTPCT  in the last 72 hours. Sepsis Labs: No results for input(s): PROCALCITON, LATICACIDVEN in the last 168 hours.  Recent Results (from the past 240 hour(s))  Urine culture     Status: Abnormal   Collection Time: 09/16/20  7:47 PM   Specimen: Urine, Random  Result Value Ref Range Status   Specimen Description   Final    URINE, RANDOM Performed at [facility], [address], [place], [place] [address]    Special Requests   Final    NONE Performed at [facility], [address] [place], [place] [address]    Culture MULTIPLE SPECIES PRESENT, SUGGEST RECOLLECTION (A)  Final   Report Status 09/18/2020 FINAL  Final  Blood culture (routine x 2)     Status: Abnormal   Collection Time: 09/16/20  7:47 PM   Specimen: BLOOD  Result Value Ref Range Status   Specimen Description   Final    BLOOD RIGHT ANTECUBITAL Performed at [facility],  [place], [place] [address]    Special Requests   Final    BOTTLES DRAWN AEROBIC AND ANAEROBIC Blood Culture adequate volume Performed at [facility], [address] [place], [place] [address]    Culture  Setup Time   Final    GRAM POSITIVE COCCI IN BOTH AEROBIC AND ANAEROBIC BOTTLES CRITICAL VALUE NOTED.  VALUE IS CONSISTENT WITH PREVIOUSLY REPORTED AND CALLED VALUE. Performed at [facility], [address] [place], [place] [address]    Culture (A)  Final    STAPHYLOCOCCUS AUREUS SUSCEPTIBILITIES PERFORMED ON PREVIOUS CULTURE WITHIN THE LAST 5 DAYS. Performed at [facility], [place] [address] [place], [place] [address]    Report Status 09/19/2020 FINAL  Final  Blood culture (routine x 2)     Status: Abnormal   Collection Time: 09/16/20  8:40 PM   Specimen: BLOOD  Result Value Ref Range Status   Specimen Description   Final    BLOOD BLOOD RIGHT FOREARM Performed at [facility], [address] [place], [place] [address]    Special Requests   Final    BOTTLES  DRAWN AEROBIC AND ANAEROBIC Blood Culture adequate volume Performed at [facility], [address] [place], [place] [address]    Culture  Setup Time   Final    GRAM POSITIVE COCCI IN BOTH AEROBIC AND ANAEROBIC BOTTLES CRITICAL RESULT CALLED TO, READ BACK BY AND VERIFIED WITH: AMY THOMPSON AT 5750 ON 09/17/20 SNG Performed at [facility], [place] [address], [place], [place] [address]    Culture METHICILLIN RESISTANT STAPHYLOCOCCUS AUREUS (A)  Final   Report Status 09/19/2020 FINAL  Final   Organism ID, Bacteria METHICILLIN RESISTANT STAPHYLOCOCCUS AUREUS  Final      Susceptibility   Methicillin resistant staphylococcus aureus - MIC*    CIPROFLOXACIN >=8 RESISTANT Resistant     ERYTHROMYCIN >=8 RESISTANT Resistant     GENTAMICIN <=0.5 SENSITIVE Sensitive     OXACILLIN >=4 RESISTANT Resistant     TETRACYCLINE <=1 SENSITIVE Sensitive     VANCOMYCIN <=0.5 SENSITIVE Sensitive     TRIMETH/SULFA >=320 RESISTANT Resistant     CLINDAMYCIN <=0.25 SENSITIVE Sensitive     RIFAMPIN <=0.5 SENSITIVE Sensitive     Inducible Clindamycin NEGATIVE Sensitive     * METHICILLIN RESISTANT STAPHYLOCOCCUS AUREUS  Blood Culture ID Panel (Reflexed)     Status: Abnormal   Collection Time: 09/16/20  8:40 PM  Result Value Ref Range Status   Enterococcus faecalis NOT DETECTED NOT DETECTED Final   Enterococcus Faecium NOT DETECTED NOT DETECTED Final   Listeria monocytogenes NOT DETECTED NOT DETECTED Final   Staphylococcus species DETECTED (A) NOT DETECTED Final    Comment: CRITICAL RESULT CALLED TO, READ BACK BY AND VERIFIED WITH: AMY THOMPSON AT 5825 ON 09/17/20 SNG    Staphylococcus aureus (BCID) DETECTED (A) NOT DETECTED Final    Comment: Methicillin (oxacillin)-resistant Staphylococcus aureus (MRSA). MRSA is predictably resistant to beta-lactam antibiotics (except ceftaroline). Preferred therapy is vancomycin unless clinically contraindicated. Patient requires contact precautions if   hospitalized. CRITICAL RESULT CALLED TO, READ BACK BY AND VERIFIED WITH: AMY THOMPSON AT 1898 ON 09/17/20 SNG    Staphylococcus epidermidis NOT DETECTED NOT DETECTED Final   Staphylococcus lugdunensis NOT DETECTED NOT DETECTED Final   Streptococcus species NOT DETECTED NOT DETECTED Final   Streptococcus agalactiae NOT DETECTED NOT DETECTED Final   Streptococcus pneumoniae NOT DETECTED NOT DETECTED Final   Streptococcus pyogenes NOT DETECTED NOT  DETECTED Final   A.calcoaceticus-baumannii NOT DETECTED NOT DETECTED Final   Bacteroides fragilis NOT DETECTED NOT DETECTED Final   Enterobacterales NOT DETECTED NOT DETECTED Final   Enterobacter cloacae complex NOT DETECTED NOT DETECTED Final   Escherichia coli NOT DETECTED NOT DETECTED Final   Klebsiella aerogenes NOT DETECTED NOT DETECTED Final   Klebsiella oxytoca NOT DETECTED NOT DETECTED Final   Klebsiella pneumoniae NOT DETECTED NOT DETECTED Final   Proteus species NOT DETECTED NOT DETECTED Final   Salmonella species NOT DETECTED NOT DETECTED Final   Serratia marcescens NOT DETECTED NOT DETECTED Final   Haemophilus influenzae NOT DETECTED NOT DETECTED Final   Neisseria meningitidis NOT DETECTED NOT DETECTED Final   Pseudomonas aeruginosa NOT DETECTED NOT DETECTED Final   Stenotrophomonas maltophilia NOT DETECTED NOT DETECTED Final   Candida albicans NOT DETECTED NOT DETECTED Final   Candida auris NOT DETECTED NOT DETECTED Final   Candida glabrata NOT DETECTED NOT DETECTED Final   Candida krusei NOT DETECTED NOT DETECTED Final   Candida parapsilosis NOT DETECTED NOT DETECTED Final   Candida tropicalis NOT DETECTED NOT DETECTED Final   Cryptococcus neoformans/gattii NOT DETECTED NOT DETECTED Final   Meth resistant mecA/C and MREJ DETECTED (A) NOT DETECTED Final    Comment: CRITICAL RESULT CALLED TO, READ BACK BY AND VERIFIED WITH: AMY THOMPSON AT 2409 ON 09/17/20 [facility] Performed at [facility], [address] [place],  [place] [address]   Resp Panel by RT-PCR (Flu A&B, Covid)     Status: None   Collection Time: 09/16/20  9:58 PM  Result Value Ref Range Status   SARS Coronavirus 2 by RT PCR NEGATIVE NEGATIVE Final    Comment: (NOTE) SARS-CoV-2 target nucleic acids are NOT DETECTED.  The SARS-CoV-2 RNA is generally detectable in upper respiratory specimens during the acute phase of infection. The lowest concentration of SARS-CoV-2 viral copies this assay can detect is 138 copies/mL. A negative result does not preclude SARS-Cov-2 infection and should not be used as the sole basis for treatment or other patient management decisions. A negative result may occur with  improper specimen collection/handling, submission of specimen other than nasopharyngeal swab, presence of viral mutation(s) within the areas targeted by this assay, and inadequate number of viral copies(<138 copies/mL). A negative result must be combined with clinical observations, patient history, and epidemiological information. The expected result is Negative.  Fact Sheet for Patients:  EntrepreneurPulse.com.au  Fact Sheet for Healthcare Providers:  IncredibleEmployment.be  This test is no t yet approved or cleared by the [place] FDA and  has been authorized for detection and/or diagnosis of SARS-CoV-2 by FDA under an Emergency Use Authorization (EUA). This EUA will remain  in effect (meaning this test can be used) for the duration of the COVID-19 declaration under Section 564(b)(1) of the Act, 21 U.S.C.section 360bbb-3(b)(1), unless the authorization is terminated  or revoked sooner.       Influenza A by PCR NEGATIVE NEGATIVE Final   Influenza B by PCR NEGATIVE NEGATIVE Final    Comment: (NOTE) The Xpert Xpress SARS-CoV-2/FLU/RSV plus assay is intended as an aid in the diagnosis of influenza from Nasopharyngeal swab specimens and should not be used as a sole basis for treatment. Nasal washings  and aspirates are unacceptable for Xpert Xpress SARS-CoV-2/FLU/RSV testing.  Fact Sheet for Patients: EntrepreneurPulse.com.au  Fact Sheet for Healthcare Providers: IncredibleEmployment.be  This test is not yet approved or cleared by the [place] FDA and has been authorized for detection and/or diagnosis of SARS-CoV-2  by FDA under an Emergency Use Authorization (EUA). This EUA will remain in effect (meaning this test can be used) for the duration of the COVID-19 declaration under Section 564(b)(1) of the Act, 21 U.S.C. section 360bbb-3(b)(1), unless the authorization is terminated or revoked.  Performed at [facility], [address] [place], [place] [address]   CULTURE, BLOOD (ROUTINE X 2) w Reflex to ID Panel     Status: None   Collection Time: 09/18/20  4:30 PM   Specimen: BLOOD  Result Value Ref Range Status   Specimen Description BLOOD BLOOD RIGHT ARM  Final   Special Requests   Final    BOTTLES DRAWN AEROBIC AND ANAEROBIC Blood Culture adequate volume   Culture   Final    NO GROWTH 5 DAYS Performed at [facility], [address] [place],  [address]    Report Status 09/23/2020 FINAL  Final  CULTURE, BLOOD (ROUTINE X 2) w Reflex to ID Panel     Status: None   Collection Time: 09/18/20  4:32 PM   Specimen: BLOOD  Result Value Ref Range Status   Specimen Description BLOOD BLOOD RIGHT HAND  Final   Special Requests   Final    BOTTLES DRAWN AEROBIC AND ANAEROBIC Blood Culture adequate volume   Culture   Final    NO GROWTH 5 DAYS Performed at [facility], [address] [place],  [address]    Report Status 09/23/2020 FINAL  Final         Radiology Studies: [place] CORE BIOPSY (LYMPH NODES)  Result Date: 09/25/2020 INDICATION: Multifocal adenopathy suspicious for metastatic disease from rectal primary. EXAM: ULTRASOUND GUIDED CORE NEEDLE BIOPSY BIOPSY OF RIGHT INGUINAL LYMPH NODE  MEDICATIONS: None. ANESTHESIA/SEDATION: Fentanyl 50 mcg IV; Versed 1 mg IV Moderate Sedation Time:  15 MINUTES The patient was continuously monitored during the procedure by the interventional radiology nurse under my direct supervision. PROCEDURE: The procedure, risks, benefits, and alternatives were explained to the patient. Questions regarding the procedure were encouraged and answered. The patient understands and consents to the procedure. The right inguinal skin was prepped with chlorhexidine in a sterile fashion, and a sterile drape was applied covering the operative field. A sterile gown and sterile gloves were used for the procedure. Local anesthesia was provided with 1% Lidocaine. Following local lidocaine administration, three 18 gauge cores were obtained utilizing continuous ultrasound guidance. Samples were sent to pathology in sterile saline. Needle removed and hemostasis achieved with 2 minutes of manual compression. Post procedure ultrasound images showed no evidence of significant hemorrhage. COMPLICATIONS: None immediate. IMPRESSION: Ultrasound-guided biopsy abnormally enlarged right inguinal lymph node as above. Electronically Signed   By: Miachel Roux M.D.   On: 09/25/2020 16:51        Scheduled Meds: . Chlorhexidine Gluconate Cloth  6 each Topical Daily  . dexamethasone (DECADRON) injection  4 mg Intravenous Q8H  . enoxaparin (LOVENOX) injection  40 mg Subcutaneous Q24H  . feeding supplement  237 mL Oral BID BM  . insulin aspart  0-9 Units Subcutaneous TID WC   Continuous Infusions: . sodium chloride 250 mL (09/25/20 2156)  . vancomycin 1,250 mg (09/25/20 2157)     LOS: 9 days    Time spent: 28 minutes    Sharen Hones, MD [facility]   To contact the attending provider between 7A-7P or the covering provider during after hours 7P-7A, please log into the web site [email or web address] and access using universal [place] password for that  web site. If you do not have  the password, please call the hospital operator.  09/26/2020, 9:18 AM

## 2020-09-27 DIAGNOSIS — C20 Malignant neoplasm of rectum: Secondary | ICD-10-CM | POA: Diagnosis not present

## 2020-09-27 DIAGNOSIS — D509 Iron deficiency anemia, unspecified: Secondary | ICD-10-CM | POA: Diagnosis not present

## 2020-09-27 DIAGNOSIS — R1900 Intra-abdominal and pelvic swelling, mass and lump, unspecified site: Secondary | ICD-10-CM

## 2020-09-27 DIAGNOSIS — C7931 Secondary malignant neoplasm of brain: Secondary | ICD-10-CM | POA: Diagnosis not present

## 2020-09-27 DIAGNOSIS — A4102 Sepsis due to Methicillin resistant Staphylococcus aureus: Secondary | ICD-10-CM | POA: Diagnosis not present

## 2020-09-27 DIAGNOSIS — G9341 Metabolic encephalopathy: Secondary | ICD-10-CM | POA: Diagnosis not present

## 2020-09-27 DIAGNOSIS — R410 Disorientation, unspecified: Secondary | ICD-10-CM | POA: Diagnosis not present

## 2020-09-27 DIAGNOSIS — C772 Secondary and unspecified malignant neoplasm of intra-abdominal lymph nodes: Secondary | ICD-10-CM

## 2020-09-27 DIAGNOSIS — R918 Other nonspecific abnormal finding of lung field: Secondary | ICD-10-CM | POA: Diagnosis not present

## 2020-09-27 LAB — GLUCOSE, CAPILLARY
Glucose-Capillary: 183 mg/dL — ABNORMAL HIGH (ref 70–99)
Glucose-Capillary: 366 mg/dL — ABNORMAL HIGH (ref 70–99)
Glucose-Capillary: 193 mg/dL — ABNORMAL HIGH (ref 70–99)
Glucose-Capillary: 212 mg/dL — ABNORMAL HIGH (ref 70–99)

## 2020-09-27 MED ORDER — POLYSACCHARIDE IRON COMPLEX 150 MG PO CAPS
150.0000 mg | ORAL_CAPSULE | Freq: Every day | ORAL | Status: DC
  Administered 2020-09-27 – 2020-09-30 (×4): 150 mg via ORAL
  Filled 2020-09-27 (×5): qty 1

## 2020-09-27 MED ORDER — DEXAMETHASONE 4 MG PO TABS
4.0000 mg | ORAL_TABLET | Freq: Three times a day (TID) | ORAL | Status: DC
  Administered 2020-09-27 – 2020-09-29 (×7): 4 mg via ORAL
  Filled 2020-09-27 (×8): qty 1

## 2020-09-27 MED ORDER — SODIUM CHLORIDE 0.9 % IV SOLN: INTRAVENOUS | Status: DC

## 2020-09-27 NOTE — Progress Notes (Signed)
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 Decel Time: 142 msec MV E velocity: 95.35 cm/s MV A velocity: 88.70 cm/s MV E/A ratio:  1.07 Ida Rogue MD Electronically signed by Ida Rogue MD Signature Date/Time: 09/18/2020/8:40:18 PM    Final    [place] CORE BIOPSY (LYMPH NODES)  Result Date: 09/25/2020 INDICATION: Multifocal adenopathy suspicious for metastatic disease from rectal primary. EXAM: ULTRASOUND GUIDED CORE NEEDLE BIOPSY BIOPSY OF RIGHT INGUINAL LYMPH NODE MEDICATIONS: None. ANESTHESIA/SEDATION: Fentanyl 50 mcg IV; Versed 1 mg IV Moderate Sedation Time:  15 MINUTES The patient was continuously monitored during the procedure by the interventional radiology nurse under my direct supervision. PROCEDURE: The procedure, risks, benefits, and alternatives were explained to the patient. Questions regarding the procedure were encouraged and answered. The patient  understands and consents to the procedure. The right inguinal skin was prepped with chlorhexidine in a sterile fashion, and a sterile drape was applied covering the operative field. A sterile gown and sterile gloves were used for the procedure. Local anesthesia was provided with 1% Lidocaine. Following local lidocaine administration, three 18 gauge cores were obtained utilizing continuous ultrasound guidance. Samples were sent to pathology in sterile saline. Needle removed and hemostasis achieved with 2 minutes of manual  compression. Post procedure ultrasound images showed Rick evidence of significant hemorrhage. COMPLICATIONS: None immediate. IMPRESSION: Ultrasound-guided biopsy abnormally enlarged right inguinal lymph node as above. Electronically Signed   By: Miachel Roux M.D.   On: 09/25/2020 16:51    ASSESSMENT: Metastatic disease including brain metastasis, confusion.  PLAN:    1.  Metastatic disease including brain metastasis: Unclear primary with significant abdominal and pelvic lymphadenopathy, lung lesion, and multiple brain metastasis.  Melanoma is a possibility.  CEA and PSA are within normal limits.  Results are pending from right inguinal biopsy completed on Friday.  Patient intends to receive his treatment near his home in [place], [place].  He reports his stepdaughter, Lorne Skeens, is an Therapist, sports and is helping to arrange follow-up.  Rick follow-up is necessary in the [place].  Appreciate palliative care input.   2.  Confusion: Nearly resolved with Decadron.  Appreciate radiation oncology input.  Rick treatment planned as patient has stated he wishes to go home to get treatment. 3.  MRSA septicemia: Unclear source.  Patient currently on vancomycin and plan for TEE tomorrow. 4.  Iron deficiency anemia: Patient has received IV iron during this admission.  Monitor. 5.  Disposition: Patient states his Joslyn Hy is coming to [place] to drive him back to [place] to initiate treatment.  Appreciate consult, will follow.  Lloyd Huger, MD   09/27/2020 10:07 AM

## 2020-09-27 NOTE — Progress Notes (Signed)
PROGRESS NOTE    Rick Marquez  ESL:[id number] DOB: [birth year] DOA: 09/16/2020 PCP: Patient, No Pcp Per   Chief complaint.  Altered mental status. Brief Narrative:  Rick Marquez a 80 y.o.malewithunknown medical history who presents to the ED for evaluation of confusion. History is limited from patient due to confusion.In the emergency room, he met sepsis criteria with tachycardia, tachypnea and leukocytosis. His blood culture was positive for MRSA. He is covered with vancomycin. Patient has some lower back pain, MRI of the lumbar spine did not show evidence of discitis. 3/10.MRI of the brain with contrast showed multiple metastatic lesions. CT scan chest/abdomen/pelvis showed mass in the lungs, abdomen. Oncology consult obtained. 3/11.  Biopsy of right inguinal lymph node performed.   Assessment & Plan:   Principal Problem:   Subacute delirium Active Problems:   Sepsis ([place])   Acute lower UTI   Anemia   Acute metabolic encephalopathy   Urinary retention   MRSA (methicillin resistant Staphylococcus aureus) septicemia (HCC)   Metastasis to brain of unknown origin ([place])   Lung mass   Pelvic mass   Palliative care encounter   Hyponatremia  #1.  Sepsis with MRSA septicemia.  Unclear etiology, TEE is tentatively scheduled on Monday. Followed by ID, continue vancomycin.  2. Delirium with acute metabolic encephalopathy. 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 Pelvic mass with subsequent hydronephrosis. Urinary retention. Continue Decadron, but change to oral to prepare for discharge. No intervention for hydronephrosis.  Still has a Foley catheter per urology Patient mental status improved, he slept better after giving Seroquel.  No confusion this morning.  3.  Iron deficient anemia. Received IV iron, will start oral iron, recheck CBC tomorrow.  4.  Urinary tract infection. Antibiotic completed.  #5.  Hyponatremia secondary to SIADH. Fluid  restriction, recheck a BMP tomorrow.   DVT prophylaxis: Lovenox Code Status: Full Family Communication:  Disposition Plan:  .   Status is: Inpatient  Remains inpatient appropriate because:Inpatient level of care appropriate due to severity of illness   Dispo: The patient is from: Home              Anticipated d/c is to: Home              Patient currently is not medically stable to d/c.   Difficult to place patient No  Plan to discharge to home in [place] to his family, he has a wife and stepdaughter at home.      I/O last 3 completed shifts: In: 291.7 [I.V.:41.7; IV Piggyback:250] Out: 2111 [Urine:4600] No intake/output data recorded.     Consultants:   ID, Cardiology  Procedures: Biopsy  Antimicrobials: Vancomycin.  Subjective: Patient slept well last night, he has no confusion today. He has good appetite without nausea vomiting. Denies any short of breath or cough. No fever or chills. Foley catheter still in place, no dysuria.  Objective: Vitals:   09/26/20 2031 09/27/20 0016 09/27/20 0617 09/27/20 0814  BP: 139/67 132/72 133/62 137/67  Pulse: 75 (!) 52 (!) 56 (!) 55  Resp: '17 17 16 16  ' Temp: 97.9 F (36.6 C) (!) 97.4 F (36.3 C) (!) 97.4 F (36.3 C) 97.6 F (36.4 C)  TempSrc:  Oral Oral Oral  SpO2: 100% 99% 99% 99%  Weight:      Height:        Intake/Output Summary (Last 24 hours) at 09/27/2020 0908 Last data filed at 09/27/2020 0625 Gross per 24 hour  Intake 291.69 ml  Output  3200 ml  Net -2908.31 ml   Filed Weights   09/16/20 1940  Weight: 68 kg    Examination:  General exam: Appears calm and comfortable, appear malnourished Respiratory system: Clear to auscultation. Respiratory effort normal. Cardiovascular system: S1 & S2 heard, RRR. No JVD, murmurs, rubs, gallops or clicks. No pedal edema. Gastrointestinal system: Abdomen is nondistended, soft and nontender. No organomegaly or masses felt. Normal bowel sounds heard. Central  nervous system: Alert and oriented x3. No focal neurological deficits. Extremities: Symmetric 5 x 5 power. Skin: No rashes, lesions or ulcers Psychiatry: Judgement and insight appear normal. Mood & affect appropriate.     Data Reviewed: I have personally reviewed following labs and imaging studies  CBC: Recent Labs  Lab 09/22/20 0701 09/23/20 0534 09/24/20 0606 09/25/20 0142  WBC 11.5* 10.2 5.2 10.0  NEUTROABS 8.9* 7.7 3.7 8.1*  HGB 7.7* 8.3* 8.5* 8.2*  HCT 26.1* 26.6* 27.9* 26.7*  MCV 78.6* 76.7* 78.2* 77.4*  PLT 296 333 323 196   Basic Metabolic Panel: Recent Labs  Lab 09/22/20 0701 09/23/20 0534 09/24/20 0606 09/25/20 0142 09/26/20 0431  NA 138 135 138 131* 132*  K 3.3* 4.1 4.2 4.7 4.3  CL 103 103 105 100 100  CO2 '27 26 25 25 24  ' GLUCOSE 109* 100* 199* 237* 163*  BUN '11 10 16 23 ' 24*  CREATININE 0.74 0.73 0.78 0.76 0.69  CALCIUM 7.9* 8.0* 8.2* 8.1* 8.1*  MG 2.0 1.9 2.3  --  2.0   GFR: Estimated Creatinine Clearance: 73.2 mL/min (by C-G formula based on SCr of 0.69 mg/dL). Liver Function Tests: No results for input(s): AST, ALT, ALKPHOS, BILITOT, PROT, ALBUMIN in the last 168 hours. No results for input(s): LIPASE, AMYLASE in the last 168 hours. No results for input(s): AMMONIA in the last 168 hours. Coagulation Profile: No results for input(s): INR, PROTIME in the last 168 hours. Cardiac Enzymes: No results for input(s): CKTOTAL, CKMB, CKMBINDEX, TROPONINI in the last 168 hours. BNP (last 3 results) No results for input(s): PROBNP in the last 8760 hours. HbA1C: No results for input(s): HGBA1C in the last 72 hours. CBG: Recent Labs  Lab 09/26/20 0809 09/26/20 1147 09/26/20 1643 09/26/20 2033 09/27/20 0816  GLUCAP 193* 219* 288* 260* 183*   Lipid Profile: No results for input(s): CHOL, HDL, LDLCALC, TRIG, CHOLHDL, LDLDIRECT in the last 72 hours. Thyroid Function Tests: No results for input(s): TSH, T4TOTAL, FREET4, T3FREE, THYROIDAB in the last 72  hours. Anemia Panel: No results for input(s): VITAMINB12, FOLATE, FERRITIN, TIBC, IRON, RETICCTPCT in the last 72 hours. Sepsis Labs: No results for input(s): PROCALCITON, LATICACIDVEN in the last 168 hours.  Recent Results (from the past 240 hour(s))  CULTURE, BLOOD (ROUTINE X 2) w Reflex to ID Panel     Status: None   Collection Time: 09/18/20  4:30 PM   Specimen: BLOOD  Result Value Ref Range Status   Specimen Description BLOOD BLOOD RIGHT ARM  Final   Special Requests   Final    BOTTLES DRAWN AEROBIC AND ANAEROBIC Blood Culture adequate volume   Culture   Final    NO GROWTH 5 DAYS Performed at [facility], [address] [place], [place] [address]    Report Status 09/23/2020 FINAL  Final  CULTURE, BLOOD (ROUTINE X 2) w Reflex to ID Panel     Status: None   Collection Time: 09/18/20  4:32 PM   Specimen: BLOOD  Result Value Ref Range Status   Specimen Description BLOOD  BLOOD RIGHT HAND  Final   Special Requests   Final    BOTTLES DRAWN AEROBIC AND ANAEROBIC Blood Culture adequate volume   Culture   Final    NO GROWTH 5 DAYS Performed at [facility], [address], [place], [place] [address]    Report Status 09/23/2020 FINAL  Final         Radiology Studies: [place] CORE BIOPSY (LYMPH NODES)  Result Date: 09/25/2020 INDICATION: Multifocal adenopathy suspicious for metastatic disease from rectal primary. EXAM: ULTRASOUND GUIDED CORE NEEDLE BIOPSY BIOPSY OF RIGHT INGUINAL LYMPH NODE MEDICATIONS: None. ANESTHESIA/SEDATION: Fentanyl 50 mcg IV; Versed 1 mg IV Moderate Sedation Time:  15 MINUTES The patient was continuously monitored during the procedure by the interventional radiology nurse under my direct supervision. PROCEDURE: The procedure, risks, benefits, and alternatives were explained to the patient. Questions regarding the procedure were encouraged and answered. The patient understands and consents to the procedure. The right inguinal skin was prepped  with chlorhexidine in a sterile fashion, and a sterile drape was applied covering the operative field. A sterile gown and sterile gloves were used for the procedure. Local anesthesia was provided with 1% Lidocaine. Following local lidocaine administration, three 18 gauge cores were obtained utilizing continuous ultrasound guidance. Samples were sent to pathology in sterile saline. Needle removed and hemostasis achieved with 2 minutes of manual compression. Post procedure ultrasound images showed no evidence of significant hemorrhage. COMPLICATIONS: None immediate. IMPRESSION: Ultrasound-guided biopsy abnormally enlarged right inguinal lymph node as above. Electronically Signed   By: Miachel Roux M.D.   On: 09/25/2020 16:51        Scheduled Meds: . Chlorhexidine Gluconate Cloth  6 each Topical Daily  . dexamethasone  4 mg Oral Q8H  . enoxaparin (LOVENOX) injection  40 mg Subcutaneous Q24H  . feeding supplement  237 mL Oral BID BM  . insulin aspart  0-9 Units Subcutaneous TID WC  . QUEtiapine  25 mg Oral QHS   Continuous Infusions: . sodium chloride 250 mL (09/26/20 2157)  . vancomycin 1,250 mg (09/26/20 2200)     LOS: 10 days    Time spent: 28 minutes    Sharen Hones, MD [facility]   To contact the attending provider between 7A-7P or the covering provider during after hours 7P-7A, please log into the web site [email or web address] and access using universal [place] password for that web site. If you do not have the password, please call the hospital operator.  09/27/2020, 9:08 AM

## 2020-09-27 NOTE — Progress Notes (Signed)
Progress Note  Patient Name: Rick Marquez Date of Encounter: 09/27/2020  [facility] Cardiologist: [facility]  Subjective   No complaints today, Mild confusion, 'Has papers he needs to fill out", "someone is coming to pick him up now"  Inpatient Medications    Scheduled Meds: . Chlorhexidine Gluconate Cloth  6 each Topical Daily  . dexamethasone  4 mg Oral Q8H  . enoxaparin (LOVENOX) injection  40 mg Subcutaneous Q24H  . feeding supplement  237 mL Oral BID BM  . insulin aspart  0-9 Units Subcutaneous TID WC  . iron polysaccharides  150 mg Oral Daily  . QUEtiapine  25 mg Oral QHS   Continuous Infusions: . sodium chloride 250 mL (09/26/20 2157)  . [START ON 09/28/2020] sodium chloride    . vancomycin 1,250 mg (09/26/20 2200)   PRN Meds: sodium chloride, acetaminophen **OR** acetaminophen, haloperidol lactate, ondansetron **OR** ondansetron (ZOFRAN) IV   Vital Signs    Vitals:   09/27/20 0016 09/27/20 0617 09/27/20 0814 09/27/20 1218  BP: 132/72 133/62 137/67 116/71  Pulse: (!) 52 (!) 56 (!) 55 83  Resp: 17 16 16 17   Temp: (!) 97.4 F (36.3 C) (!) 97.4 F (36.3 C) 97.6 F (36.4 C) 97.8 F (36.6 C)  TempSrc: Oral Oral Oral   SpO2: 99% 99% 99% 100%  Weight:      Height:        Intake/Output Summary (Last 24 hours) at 09/27/2020 1450 Last data filed at 09/27/2020 0625 Gross per 24 hour  Intake 291.69 ml  Output 3200 ml  Net -2908.31 ml   Last 3 Weights 09/16/2020  Weight (lbs) 150 lb  Weight (kg) 68.04 kg      Telemetry    Normal sinus rhythm- Personally Reviewed  ECG     - Personally Reviewed  Physical Exam   GEN: No acute distress.   Neck: No JVD Cardiac: RRR, no murmurs, rubs, or gallops.  Respiratory: Clear to auscultation bilaterally. GI: Soft, nontender, non-distended  MS: No edema; No deformity. Neuro:  Nonfocal  Psych: Mild confusion  Labs    High Sensitivity Troponin:  No results for input(s): TROPONINIHS in the last 720 hours.     Chemistry Recent Labs  Lab 09/24/20 0606 09/25/20 0142 09/26/20 0431  NA 138 131* 132*  K 4.2 4.7 4.3  CL 105 100 100  CO2 25 25 24   GLUCOSE 199* 237* 163*  BUN 16 23 24*  CREATININE 0.78 0.76 0.69  CALCIUM 8.2* 8.1* 8.1*  GFRNONAA >60 >60 >60  ANIONGAP 8 6 8      Hematology Recent Labs  Lab 09/23/20 0534 09/24/20 0606 09/25/20 0142  WBC 10.2 5.2 10.0  RBC 3.47* 3.57* 3.45*  HGB 8.3* 8.5* 8.2*  HCT 26.6* 27.9* 26.7*  MCV 76.7* 78.2* 77.4*  MCH 23.9* 23.8* 23.8*  MCHC 31.2 30.5 30.7  RDW 17.5* 17.4* 17.5*  PLT 333 323 381    BNPNo results for input(s): BNP, PROBNP in the last 168 hours.   DDimer No results for input(s): DDIMER in the last 168 hours.   Radiology    No results found.  Cardiac Studies   Echocardiogram 1. Left ventricular ejection fraction, by estimation, is 55 to 60%. The  left ventricle has normal function. The left ventricle has no regional  wall motion abnormalities. Left ventricular diastolic parameters are  consistent with Grade I diastolic  dysfunction (impaired relaxation). The average left ventricular global  longitudinal strain is -11.7 %.  2. Right ventricular  systolic function is normal. The right ventricular  size is normal.  3. The mitral valve is normal in structure. Mild mitral valve  regurgitation.  4. No valve vegetation noted.   Patient Profile     Rick Marquez is a 80 y.o. male with a hx of prostate cancer with surgery, diabetes, hypertension,  presenting with encephalopathy/confusion, MRSA bacteremia   Assessment & Plan    1. Confusion/delirium Seen by psychiatry, Brain mets on imaging Psychiatry feels that he can consent to TEE  2.  MRSA bacteremia ID notes reviewed, unclear source, chronic Foley catheter in place MRI lumbar spine no infection, no discitis or osteomyelitis Severe left hydroureteronephrosis Currently on vancomycin, repeat blood cultures negative --Scheduled for TEE tomorrow  3.  Prostate cancer Severe left hydroureteronephrosis Left pelvic adenopathy causing left ureteral obstruction Followed by urology  4. Anemia Had 1 unit transfusion Cancer as below  5.  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 --Bulky multifocal pelvic adenopathy, more so on the left, as described. There is irregular nodular rectal wall thickening which is suspicious for rectal primary in this setting   Total encounter time more than 25 minutes  Greater than 50% was spent in counseling and coordination of care with the patient   For questions or updates, please contact [place] Please consult [email or web address] for contact info under        Signed, Ida Rogue, MD  09/27/2020, 2:50 PM

## 2020-09-28 ENCOUNTER — Encounter
Admission: EM | Disposition: A | Discharge status: Home / Self Care | Payer: Self-pay | Source: Home / Self Care | Attending: Internal Medicine

## 2020-09-28 ENCOUNTER — Ambulatory Visit: Admit: 2020-09-28 | Payer: Medicare (Managed Care)

## 2020-09-28 ENCOUNTER — Encounter: Payer: Self-pay | Admitting: Internal Medicine

## 2020-09-28 ENCOUNTER — Inpatient Hospital Stay (HOSPITAL_COMMUNITY)
Admit: 2020-09-28 | Discharge: 2020-09-28 | Disposition: A | Discharge status: Home / Self Care | Payer: Medicare (Managed Care) | Attending: Physician Assistant | Admitting: Physician Assistant

## 2020-09-28 ENCOUNTER — Inpatient Hospital Stay (HOSPITAL_COMMUNITY)
Admit: 2020-09-28 | Discharge: 2020-09-28 | Disposition: A | Discharge status: Home / Self Care | Payer: Medicare (Managed Care) | Attending: Infectious Diseases | Admitting: Infectious Diseases

## 2020-09-28 DIAGNOSIS — D649 Anemia, unspecified: Secondary | ICD-10-CM | POA: Diagnosis not present

## 2020-09-28 DIAGNOSIS — F05 Delirium due to known physiological condition: Secondary | ICD-10-CM

## 2020-09-28 DIAGNOSIS — R7881 Bacteremia: Secondary | ICD-10-CM

## 2020-09-28 DIAGNOSIS — C7931 Secondary malignant neoplasm of brain: Secondary | ICD-10-CM | POA: Diagnosis not present

## 2020-09-28 DIAGNOSIS — B9562 Methicillin resistant Staphylococcus aureus infection as the cause of diseases classified elsewhere: Secondary | ICD-10-CM | POA: Diagnosis not present

## 2020-09-28 DIAGNOSIS — A419 Sepsis, unspecified organism: Secondary | ICD-10-CM | POA: Diagnosis not present

## 2020-09-28 DIAGNOSIS — A4102 Sepsis due to Methicillin resistant Staphylococcus aureus: Secondary | ICD-10-CM | POA: Diagnosis not present

## 2020-09-28 DIAGNOSIS — I38 Endocarditis, valve unspecified: Secondary | ICD-10-CM | POA: Diagnosis not present

## 2020-09-28 DIAGNOSIS — G9341 Metabolic encephalopathy: Secondary | ICD-10-CM | POA: Diagnosis not present

## 2020-09-28 DIAGNOSIS — D509 Iron deficiency anemia, unspecified: Secondary | ICD-10-CM | POA: Diagnosis not present

## 2020-09-28 HISTORY — PX: TEE WITHOUT CARDIOVERSION: SHX5443

## 2020-09-28 LAB — CBC WITH DIFFERENTIAL/PLATELET
Abs Immature Granulocytes: 0.44 10*3/uL — ABNORMAL HIGH (ref 0.00–0.07)
Basophils Absolute: 0 10*3/uL (ref 0.0–0.1)
Basophils Relative: 0 %
Eosinophils Absolute: 0 10*3/uL (ref 0.0–0.5)
Eosinophils Relative: 0 %
HCT: 29.9 % — ABNORMAL LOW (ref 39.0–52.0)
Hemoglobin: 9.2 g/dL — ABNORMAL LOW (ref 13.0–17.0)
Immature Granulocytes: 4 %
Lymphocytes Relative: 17 %
Lymphs Abs: 1.9 10*3/uL (ref 0.7–4.0)
MCH: 23.8 pg — ABNORMAL LOW (ref 26.0–34.0)
MCHC: 30.8 g/dL (ref 30.0–36.0)
MCV: 77.3 fL — ABNORMAL LOW (ref 80.0–100.0)
Monocytes Absolute: 0.4 10*3/uL (ref 0.1–1.0)
Monocytes Relative: 3 %
Neutro Abs: 8.5 10*3/uL — ABNORMAL HIGH (ref 1.7–7.7)
Neutrophils Relative %: 76 %
Platelets: 363 10*3/uL (ref 150–400)
RBC: 3.87 MIL/uL — ABNORMAL LOW (ref 4.22–5.81)
RDW: 18.6 % — ABNORMAL HIGH (ref 11.5–15.5)
WBC: 11.2 10*3/uL — ABNORMAL HIGH (ref 4.0–10.5)
nRBC: 0 % (ref 0.0–0.2)

## 2020-09-28 LAB — MAGNESIUM: Magnesium: 2.1 mg/dL (ref 1.7–2.4)

## 2020-09-28 LAB — GLUCOSE, CAPILLARY
Glucose-Capillary: 192 mg/dL — ABNORMAL HIGH (ref 70–99)
Glucose-Capillary: 231 mg/dL — ABNORMAL HIGH (ref 70–99)
Glucose-Capillary: 159 mg/dL — ABNORMAL HIGH (ref 70–99)
Glucose-Capillary: 234 mg/dL — ABNORMAL HIGH (ref 70–99)
Glucose-Capillary: 268 mg/dL — ABNORMAL HIGH (ref 70–99)
Glucose-Capillary: 305 mg/dL — ABNORMAL HIGH (ref 70–99)

## 2020-09-28 LAB — BASIC METABOLIC PANEL
Anion gap: 8 (ref 5–15)
BUN: 28 mg/dL — ABNORMAL HIGH (ref 8–23)
CO2: 25 mmol/L (ref 22–32)
Calcium: 8.2 mg/dL — ABNORMAL LOW (ref 8.9–10.3)
Chloride: 100 mmol/L (ref 98–111)
Creatinine, Ser: 0.75 mg/dL (ref 0.61–1.24)
GFR, Estimated: >60 mL/min (ref >60)
Glucose, Bld: 230 mg/dL — ABNORMAL HIGH (ref 70–99)
Potassium: 4.5 mmol/L (ref 3.5–5.1)
Sodium: 133 mmol/L — ABNORMAL LOW (ref 135–145)

## 2020-09-28 SURGERY — ECHOCARDIOGRAM, TRANSESOPHAGEAL
Anesthesia: Moderate Sedation

## 2020-09-28 MED ORDER — MIDAZOLAM HCL 5 MG/5ML IJ SOLN
INTRAMUSCULAR | Status: AC
  Filled 2020-09-28: qty 5

## 2020-09-28 MED ORDER — MIDAZOLAM HCL 2 MG/2ML IJ SOLN
INTRAMUSCULAR | Status: AC | PRN
  Administered 2020-09-28: 1 mg via INTRAVENOUS

## 2020-09-28 MED ORDER — INSULIN ASPART 100 UNIT/ML ~~LOC~~ SOLN
0.0000 [IU] | Freq: Three times a day (TID) | SUBCUTANEOUS | Status: DC
  Administered 2020-09-28: 7 [IU] via SUBCUTANEOUS
  Administered 2020-09-29: 2 [IU] via SUBCUTANEOUS
  Administered 2020-09-29 (×2): 9 [IU] via SUBCUTANEOUS
  Administered 2020-09-29 – 2020-09-30 (×2): 2 [IU] via SUBCUTANEOUS
  Filled 2020-09-28 (×6): qty 1

## 2020-09-28 MED ORDER — FENTANYL CITRATE (PF) 100 MCG/2ML IJ SOLN
INTRAMUSCULAR | Status: AC | PRN
  Administered 2020-09-28: 25 ug via INTRAVENOUS

## 2020-09-28 MED ORDER — LIDOCAINE VISCOUS HCL 2 % MT SOLN
OROMUCOSAL | Status: AC
  Administered 2020-09-28: 15 mL
  Filled 2020-09-28: qty 15

## 2020-09-28 MED ORDER — MIDAZOLAM HCL 5 MG/5ML IJ SOLN
INTRAMUSCULAR | Status: AC | PRN
  Administered 2020-09-28 (×2): 1 mg via INTRAVENOUS

## 2020-09-28 MED ORDER — FENTANYL CITRATE (PF) 100 MCG/2ML IJ SOLN
INTRAMUSCULAR | Status: AC
  Filled 2020-09-28: qty 2

## 2020-09-28 MED ORDER — BUTAMBEN-TETRACAINE-BENZOCAINE 2-2-14 % EX AERO
INHALATION_SPRAY | CUTANEOUS | Status: AC
  Administered 2020-09-28: 4
  Filled 2020-09-28: qty 5

## 2020-09-28 MED ORDER — SODIUM CHLORIDE FLUSH 0.9 % IV SOLN
INTRAVENOUS | Status: AC
  Filled 2020-09-28: qty 10

## 2020-09-28 NOTE — TOC Progression Note (Signed)
Transition of Care [facility]) - Progression Note    Patient Details  Name: Rick Marquez MRN: [id number] Date of Birth: 21-Apr-1941  Transition of Care [facility]) CM/SW [place], LCSW Phone Number: 09/28/2020, 3:41 PM  Clinical Narrative:   [facility] in [place] but they are not in network with patient's insurance. CSW called patient's stepdaughter to make her aware. Next preference is [facility] [place]. CSW spoke to admissions coordinator and confirmed they are in network. Faxed referral. Asked MD to enter PT/OT orders. Stepdaughter unable to pick patient up and bring him back to [place]. She has been speaking with social workers through [facility] to see if patient can fly back. Discussed potential for placement in [place] until treatment complete and someone can pick him up but she doesn't think he would like that.   Expected Discharge Plan and Services                                                 Social Determinants of Health (SDOH) Interventions    Readmission Risk Interventions No flowsheet data found.

## 2020-09-28 NOTE — Progress Notes (Signed)
*  PRELIMINARY RESULTS* Echocardiogram Echocardiogram Transesophageal has been performed.  Sherrie Sport 09/28/2020, 11:21 AM

## 2020-09-28 NOTE — Progress Notes (Signed)
*  PRELIMINARY RESULTS* Echocardiogram 2D Echocardiogram has been performed.  Rick Marquez 09/28/2020, 7:18 PM

## 2020-09-28 NOTE — Consult Note (Signed)
Pharmacy Antibiotic Note  Rick Marquez is a 80 y.o. male admitted on 09/16/2020 with sepsis. Pt brought in altered and confused from [facility]. Pt is from [place] with incomplete PMH. PMH includes prostate cancer, diabetes and hypertension, unknown home medications. Recent UTI per pt, taking AZO. Pt has chronic foley cath, not replaced since admission. Pharmacy has been consulted for vancomycin dosing.  Day #12 vancomycin  TEE 3/14 was unsuccessful d/t hiatal hernia   TTE 3/4 negative to vegetation  Renal: SCr stable  WBC 11.2  Biopsy 3/11 as MRI brain revealed brain mets,  CT abdomen - adenopathy  Vancomycin levels   Vancomycin dose 3/10 at 23:28 on 500mg  IV q12h  Vanco peak = 19 at 01:42 3/11  vanco trough = 12 at 10:50 3/11  AUC = 373.5, True Cmax = 20.2, Cmin = 11.6 T1/2 = 13.8h  Plan:  Continue vancomycin 1250mg  IV q24h for AUC = 467  (peak = 32, trough = 10.4)  Follow renal function  Recheck vancomycin levels in ~2 days (sooner if change in renal function)   Height: 5\' 11"  (180.3 cm) Weight: 68 kg (150 lb) IBW/kg (Calculated) : 75.3  Temp (24hrs), Avg:97.8 F (36.6 C), Min:97.6 F (36.4 C), Max:98 F (36.7 C)  Recent Labs  Lab 09/22/20 0701 09/23/20 0534 09/24/20 0606 09/25/20 0142 09/25/20 1050 09/26/20 0431 09/28/20 0502  WBC 11.5* 10.2 5.2 10.0  --   --  11.2*  CREATININE 0.74 0.73 0.78 0.76  --  0.69 0.75  VANCOTROUGH  --   --   --   --  12*  --   --   VANCOPEAK  --   --   --  19*  --   --   --     Estimated Creatinine Clearance: 73.2 mL/min (by C-G formula based on SCr of 0.75 mg/dL).    Not on File  Antimicrobials this admission: 3/2 ceftriaxone >> 3/6 3/3 vancomycin >>  Microbiology results: 3/2 BCx: 2/4 bottles, 1/2 sets MRSA 3/2 UCx: multiple species 3/4 Bcx NG  Thank you for allowing pharmacy to be a part of this patient's care.  Darnelle Bos, PharmD 09/28/2020 2:47 PM

## 2020-09-28 NOTE — Progress Notes (Signed)
   Date of Admission:  09/16/2020     ID: Rick Marquez is a 80 y.o. 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 [MAR Hold] Chlorhexidine Gluconate Cloth  6 each Topical Daily  . [MAR Hold] dexamethasone  4 mg Oral Q8H  . [MAR Hold] enoxaparin (LOVENOX) injection  40 mg Subcutaneous Q24H  . [MAR Hold] feeding supplement  237 mL Oral BID BM  . fentaNYL      . [MAR Hold] insulin aspart  0-9 Units Subcutaneous TID WC  . [MAR Hold] iron polysaccharides  150 mg Oral Daily  . midazolam      . [MAR Hold] QUEtiapine  25 mg Oral QHS  . sodium chloride flush        Objective: Vital signs in last 24 hours: Temp:  [97.6 F (36.4 C)-98 F (36.7 C)] 98 F (36.7 C) (03/14 0753) Pulse Rate:  [57-100] 62 (03/14 1130) Resp:  [13-22] 13 (03/14 1130) BP: (116-169)/(67-84) 126/69 (03/14 1130) SpO2:  [96 %-100 %] 97 % (03/14 1130)  PHYSICAL EXAM:  General: Sleeping Lungs: Clear to auscultation bilaterally. No Wheezing or Rhonchi. No rales. Heart: Regular rate and rhythm, no murmur, rub or gallop. Abdomen: Soft, non-tender,not distended. Bowel sounds normal. No masses Extremities: atraumatic, no cyanosis. No edema. No clubbing Lab Results Recent Labs    09/26/20 0431 09/28/20 0502  WBC  --  11.2*  HGB  --  9.2*  HCT  --  29.9*  NA 132* 133*  K 4.3 4.5  CL 100 100  CO2 24 25  BUN 24* 28*  CREATININE 0.69 0.75  Microbiology: 3-2/22 blood culture MRSA 09/18/2020 blood culture no growth Studies/Results: No results  found.   Assessment/Plan: MRSA bacteremia.  On vancomycin day 1 12.  2D echo was a limited study but the valves had no vegetation.  TEE was attempted but could not visualize the valves due to hiatus hernia.  Source of bacteremia is unclear. She wanted him to go to a nursing home in [place]. If patient is being transferred to a nursing home in [place] then we can get a PICC line and arrange for IV antibiotics.  If not we will give a dose of oritavancin which is a long-acting glycopeptide and discharge him on oral antibiotics.  Spoke to his stepdaughter.   Patient has extensive metastatic malignancy.  Cerebral mets, lung lesions and lymphadenopathy in the abdomen.  Underwent right inguinal lymph node biopsy.  Pathology still pending.  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  Delirium with encephalopathy.  Discussed the management with the care team.

## 2020-09-28 NOTE — CV Procedure (Signed)
TEE was attempted.  The probe was passed with moderate difficulty.  The probe was advanced and I was not able to see any clear images of the heart in spite of changing position and angulation.  Initially, I thought there was a faulty probe and thus we used a completely different machine and probe.  Again I was not able to visualize the heart.  I was able to visualize the aorta very easily.  Thus, the procedure was aborted.  I then reviewed the CT chest images which showed large hiatal hernia with more than half of the stomach in the chest and I suspect that is the cause of inability to visualize the heart with TEE.  TEE is not technically possible on this patient.   During this procedure the patient is administered a total of Versed 3 mg and Fentanyl 25 mcg to achieve and maintain moderate conscious sedation.  The patient's heart rate, blood pressure, and oxygen saturation are monitored continuously during the procedure. The period of conscious sedation is 15 minutes, of which I was present face-to-face 100% of this time.

## 2020-09-28 NOTE — Sedation Documentation (Signed)
Procedure ended: unable to see any images : probe not advanced past aorta. Probe out. Pt. Tolerated procedure well.

## 2020-09-28 NOTE — Progress Notes (Signed)
PROGRESS NOTE    Rick Marquez  XIP:[id number] DOB: January 31, 1941 DOA: 09/16/2020 PCP: Patient, No Pcp Per   Chief complaint.  Altered mental status Brief Narrative:  Rick Marquez a 80 y.o.malewithunknown medical history who presents to the ED for evaluation of confusion. History is limited from patient due to confusion.In the emergency room, he met sepsis criteria with tachycardia, tachypnea and leukocytosis. His blood culture was positive for MRSA. He is covered with vancomycin. Patient has some lower back pain, MRI of the lumbar spine did not show evidence of discitis. 3/10.MRI of the brain with contrast showed multiple metastatic lesions. CT scan chest/abdomen/pelvis showed mass in the lungs, abdomen. Oncology consult obtained. 3/11.Biopsy of right inguinal lymph node performed.    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  Sepsis with MRSA septicemia. Still unclear etiology.  TEE could not be performed due to hiatal hernia. Repeating a TTE to look at valvular function, potential discharge home tomorrow with IV antibiotics.   2. Delirium with acute metabolic encephalopathy. 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 Pelvic mass with subsequent hydronephrosis. Urinary retention. Patient still the Foley catheter. No intervention for hydronephrosis per urology. He sleeps better after Seroquel, continue oral steroids at time of discharge.  3.  Iron deficient anemia. Hemoglobin stable per  4.  Urinary tract infection. Resolved.  5.  Hyponatremia secondary to SIADH. Stable.      DVT prophylaxis: Lovenox Code Status:  Full Family Communication:  Disposition Plan:  .   Status is: Inpatient  Remains inpatient appropriate because:Inpatient level of care appropriate due to severity of illness   Dispo: The patient is from: Home              Anticipated d/c is to: Home              Patient currently is not medically stable to d/c.   Difficult to place patient No  We will keep patient in hospital for another day, repeat echocardiogram as patient cannot get a TEE.  Set up home IV infusion, spoke with Education officer, museum, it is set up infusion in [place].      I/O last 3 completed shifts: In: 580.6 [I.V.:80.6; IV Piggyback:500] Out: 2800 [Urine:2800] Total I/O In: 0  Out: 2000 [Urine:2000]     Consultants:   ID, card  Procedures: Biopsy  Antimicrobials: Vancomycin  Subjective: Patient doing better today, he does not have any confusion this morning.  He slept well last night. No headache or dizziness per No chest pain short of breath, no cough. He still have a Foley catheter without any abdominal pain or nausea vomiting.  Objective: Vitals:   09/28/20 1115 09/28/20 1130 09/28/20 1145 09/28/20 1230  BP: 123/71 126/69 124/68 (!) 150/73  Pulse: 64 62 (!) 53 (!) 50  Resp: _0 Temp:    97.9 F (36.6 C)  TempSrc:      SpO2: 98% 97% 99% 100%  Weight:      Height:        Intake/Output Summary (Last 24 hours) at 09/28/2020 1238 Last data filed at 09/28/2020 0849 Gross per  24 hour  Intake 580.61 ml  Output 2800 ml  Net -2219.39 ml   Filed Weights   09/16/20 1940  Weight: 68 kg    Examination:  General exam: Appears calm and comfortable  Respiratory system: Clear to auscultation. Respiratory effort normal. Cardiovascular system: S1 & S2 heard, RRR. No JVD, murmurs, rubs, gallops or clicks. No pedal edema. Gastrointestinal system: Abdomen is nondistended, soft and nontender. No organomegaly or masses felt. Normal bowel sounds heard. Central nervous system: Alert and oriented.  No focal neurological deficits. Extremities: Symmetric 5 x 5 power. Skin: No rashes, lesions or ulcers Psychiatry: Judgement and insight appear normal. Mood & affect appropriate.     Data Reviewed: I have personally reviewed following labs and imaging studies  CBC: Recent Labs  Lab 09/22/20 0701 09/23/20 0534 09/24/20 0606 09/25/20 0142 09/28/20 0502  WBC 11.5* 10.2 5.2 10.0 11.2*  NEUTROABS 8.9* 7.7 3.7 8.1* 8.5*  HGB 7.7* 8.3* 8.5* 8.2* 9.2*  HCT 26.1* 26.6* 27.9* 26.7* 29.9*  MCV 78.6* 76.7* 78.2* 77.4* 77.3*  PLT 296 333 323 381 309   Basic Metabolic Panel: Recent Labs  Lab 09/22/20 0701 09/23/20 0534 09/24/20 0606 09/25/20 0142 09/26/20 0431 09/28/20 0502  NA 138 135 138 131* 132* 133*  K 3.3* 4.1 4.2 4.7 4.3 4.5  CL 103 103 105 100 100 100  CO2 _0 GLUCOSE 109* 100* 199* 237* 163* 230*  BUN _1 24* 28*  CREATININE 0.74 0.73 0.78 0.76 0.69 0.75  CALCIUM 7.9* 8.0* 8.2* 8.1* 8.1* 8.2*  MG 2.0 1.9 2.3  --  2.0 2.1   GFR: Estimated Creatinine Clearance: 73.2 mL/min (by C-G formula based on SCr of 0.75 mg/dL). Liver Function Tests: No results for input(s): AST, ALT, ALKPHOS, BILITOT, PROT, ALBUMIN in the last 168 hours. No results for input(s): LIPASE, AMYLASE in the last 168 hours. No results for input(s): AMMONIA in the last 168 hours. Coagulation Profile: No results for input(s): INR, PROTIME in the last 168 hours. Cardiac Enzymes: No results for input(s): CKTOTAL, CKMB, CKMBINDEX, TROPONINI in the last 168 hours. BNP (last 3 results) No results for input(s): PROBNP in the last 8760 hours. HbA1C: No results for input(s): HGBA1C in the last 72 hours. CBG: Recent Labs  Lab 09/27/20 1218 09/27/20 1631 09/27/20 2130 09/28/20 0757 09/28/20 1210  GLUCAP 366* 193* 212* 192* 231*   Lipid Profile: No results for input(s): CHOL, HDL, LDLCALC, TRIG, CHOLHDL, LDLDIRECT in the last 72 hours. Thyroid Function Tests: No results for  input(s): TSH, T4TOTAL, FREET4, T3FREE, THYROIDAB in the last 72 hours. Anemia Panel: No results for input(s): VITAMINB12, FOLATE, FERRITIN, TIBC, IRON, RETICCTPCT in the last 72 hours. Sepsis Labs: No results for input(s): PROCALCITON, LATICACIDVEN in the last 168 hours.  Recent Results (from the past 240 hour(s))  CULTURE, BLOOD (ROUTINE X 2) w Reflex to ID Panel     Status: None   Collection Time: 09/18/20  4:30 PM   Specimen: BLOOD  Result Value Ref Range Status   Specimen Description BLOOD BLOOD RIGHT ARM  Final   Special Requests   Final    BOTTLES DRAWN AEROBIC AND ANAEROBIC Blood Culture adequate volume   Culture   Final    NO GROWTH 5 DAYS Performed at [facility], [address] [place], [place] [address]    Report Status 09/23/2020 FINAL  Final  CULTURE, BLOOD (ROUTINE X 2) w Reflex to ID Panel  Status: None   Collection Time: 09/18/20  4:32 PM   Specimen: BLOOD  Result Value Ref Range Status   Specimen Description BLOOD BLOOD RIGHT HAND  Final   Special Requests   Final    BOTTLES DRAWN AEROBIC AND ANAEROBIC Blood Culture adequate volume   Culture   Final    NO GROWTH 5 DAYS Performed at [facility], [address] [place], [place] [address]    Report Status 09/23/2020 FINAL  Final         Radiology Studies: ECHO TEE  Result Date: 09/28/2020    TRANSESOPHOGEAL ECHO REPORT   Patient Name:   Bentzion Jenetta Downer Argueta Date of Exam: 09/28/2020 Medical Rec #:  [id number]      Height:       71.0 in Accession #:    [id number]     Weight:       150.0 lb Date of Birth:  07/05/1941       BSA:          1.866 m Patient Age:    30 years       BP:           127/81 mmHg Patient Gender: M              HR:           71 bpm. Exam Location:  [facility] Procedure: Transesophageal Echo Indications:     Bacteremia 790.7/ R78.81  History:         Patient has prior history of Echocardiogram examinations, most                  recent 09/18/2020. Risk Factors:Diabetes.  Sonographer:      Sherrie Sport RDCS (AE) Referring Phys:  [id number] Arvil Chaco Diagnosing Phys: Kathlyn Sacramento MD PROCEDURE: The transesophogeal probe was passed without difficulty through the esophogus of the patient. Local oropharyngeal anesthetic was provided with Benzocaine spray and viscous lidocaine. Sedation performed by performing physician. The patient developed no complications during the procedure. The probe passed with moderate difficulty. I advanced the probe 30-40 cm but was not able to get any cardiac images in spite of changing angulation. I changed to a different probe and machine but had the same result. The aorta was seen without difficulty. The procedure was aborted. I reviewed recent CT chest which showed large hiatal hernia with large part of the stomach in the chest. This is the likely cause of inability to visualize the heart. IMPRESSIONS  1. The probe passed with moderate difficulty. I advanced the probe 30-40 cm but was not able to get any cardiac images in spite of changing angulation. I changed to a different probe and machine but had the same result. The aorta was seen without difficulty. The procedure was aborted. I reviewed recent CT chest which showed large hiatal hernia with large part of the stomach in the chest. This is the likely cause of inability to visualize the heart. Kathlyn Sacramento MD Electronically signed by Kathlyn Sacramento MD Signature Date/Time: 09/28/2020/12:30:30 PM    Final         Scheduled Meds: . Chlorhexidine Gluconate Cloth  6 each Topical Daily  . dexamethasone  4 mg Oral Q8H  . enoxaparin (LOVENOX) injection  40 mg Subcutaneous Q24H  . feeding supplement  237 mL Oral BID BM  . fentaNYL      . insulin aspart  0-9 Units Subcutaneous TID WC  . iron polysaccharides  150 mg Oral Daily  .  midazolam      . QUEtiapine  25 mg Oral QHS  . sodium chloride flush       Continuous Infusions: . sodium chloride Stopped (09/27/20 0138)  . vancomycin Stopped (09/28/20 0121)      LOS: 11 days    Time spent: 28 minutes    Sharen Hones, MD [facility]   To contact the attending provider between 7A-7P or the covering provider during after hours 7P-7A, please log into the web site [email or web address] and access using universal [place] password for that web site. If you do not have the password, please call the hospital operator.  09/28/2020, 12:38 PM

## 2020-09-28 NOTE — Sedation Documentation (Signed)
Having issues with ECHO probe; to change out entire machine .

## 2020-09-28 NOTE — Consult Note (Signed)
NEW PATIENT EVALUATION  Name: Rick Marquez  MRN: [id number]  Date:   09/16/2020     DOB: 03-30-1941   This 80 y.o. male patient presents to the clinic for initial evaluation of brain metastasis.  REFERRING PHYSICIAN: No ref. provider found  CHIEF COMPLAINT:  Chief Complaint  Patient presents with  . Psychiatric Evaluation    DIAGNOSIS: The primary encounter diagnosis was Altered mental status, unspecified altered mental status type. Diagnoses of Sepsis without acute organ dysfunction, due to unspecified organism [facility]), Urinary tract infection without hematuria, site unspecified, Hydronephrosis, Confusion, Pelvic lymphadenopathy, and Lymphadenopathy were also pertinent to this visit.   PREVIOUS INVESTIGATIONS:  MRI of brain CT chest abdomen pelvis reviewed Clinical notes reviewed  HPI: Patient is a 81 year old male passing through the [place] region had episode of confusion brought to the emergency room for evaluation of confusion.  His blood culture at that time was positive for MRSA.  He had MRI of his brain which showed multiple enhancing lesions compatible with metastatic disease.  CT scan of the chest showed bulky pelvic adenopathy with thickened rectal wall suspicious for primary rectal cancer.  He also had a 2.8 cm right perihilar mass consistent with either primary lung cancer or metastatic disease.  He was started on Decadron his confusion subsided.  I was asked to evaluate him for possible treatment.  Patient is from [place] and would like to be discharged and treated back in that region.  PLANNED TREATMENT REGIMEN: Transfer to [place] HISTORY:  has a past medical history of Diabetes mellitus without complication ([place]).    PAST SURGICAL HISTORY: History reviewed. No pertinent surgical history.  FAMILY HISTORY: family history is not on file.  SOCIAL HISTORY:  reports that he has quit smoking. His smoking use included cigarettes. He quit after  3.00 years of use. He does not have any smokeless tobacco history on file.  ALLERGIES: Patient has no allergy information on record.  MEDICATIONS:  Current Facility-Administered Medications  Medication Dose Route Frequency Provider Last Rate Last Admin  . 0.9 %  sodium chloride infusion   Intravenous PRN Swayze, Ava, DO   Stopped at 09/27/20 0138  . acetaminophen (TYLENOL) tablet 650 mg  650 mg Oral Q6H PRN Lenore Cordia, MD   650 mg at 09/27/20 2101   Or  . acetaminophen (TYLENOL) suppository 650 mg  650 mg Rectal Q6H PRN Lenore Cordia, MD      . Chlorhexidine Gluconate Cloth 2 % PADS 6 each  6 each Topical Daily Swayze, Ava, DO   6 each at 09/27/20 0905  . dexamethasone (DECADRON) tablet 4 mg  4 mg Oral Q8H Sharen Hones, MD   4 mg at 09/28/20 0543  . enoxaparin (LOVENOX) injection 40 mg  40 mg Subcutaneous Q24H Lorna Dibble, RPH   40 mg at 09/27/20 2210  . feeding supplement (ENSURE ENLIVE / ENSURE PLUS) liquid 237 mL  237 mL Oral BID BM Sharen Hones, MD   237 mL at 09/27/20 1526  . fentaNYL (SUBLIMAZE) 100 MCG/2ML injection           . haloperidol lactate (HALDOL) injection 1 mg  1 mg Intravenous Q6H PRN Clapacs, Madie Reno, MD   1 mg at 09/25/20 2343  . insulin aspart (novoLOG) injection 0-9 Units  0-9 Units Subcutaneous TID WC Lenore Cordia, MD   2 Units at 09/27/20 1714  . iron polysaccharides (NIFEREX) capsule 150 mg  150 mg Oral Daily  Sharen Hones, MD   150 mg at 09/27/20 1246  . midazolam (VERSED) 5 MG/5ML injection           . ondansetron (ZOFRAN) tablet 4 mg  4 mg Oral Q6H PRN Lenore Cordia, MD       Or  . ondansetron (ZOFRAN) injection 4 mg  4 mg Intravenous Q6H PRN Zada Finders R, MD      . QUEtiapine (SEROQUEL) tablet 25 mg  25 mg Oral QHS Sharen Hones, MD   25 mg at 09/27/20 2210  . sodium chloride flush 0.9 % injection           . vancomycin (VANCOREADY) IVPB 1250 mg/250 mL  1,250 mg Intravenous Q24H Berton Mount, RPH   Stopped at 09/28/20 0121    ECOG  PERFORMANCE STATUS:  1 - Symptomatic but completely ambulatory  REVIEW OF SYSTEMS: Patient denies any weight loss, fatigue, weakness, fever, chills or night sweats. Patient denies any loss of vision, blurred vision. Patient denies any ringing  of the ears or hearing loss. No irregular heartbeat. Patient denies heart murmur or history of fainting. Patient denies any chest pain or pain radiating to her upper extremities. Patient denies any shortness of breath, difficulty breathing at night, cough or hemoptysis. Patient denies any swelling in the lower legs. Patient denies any nausea vomiting, vomiting of blood, or coffee ground material in the vomitus. Patient denies any stomach pain. Patient states has had normal bowel movements no significant constipation or diarrhea. Patient denies any dysuria, hematuria or significant nocturia. Patient denies any problems walking, swelling in the joints or loss of balance. Patient denies any skin changes, loss of hair or loss of weight. Patient denies any excessive worrying or anxiety or significant depression. Patient denies any problems with insomnia. Patient denies excessive thirst, polyuria, polydipsia. Patient denies any swollen glands, patient denies easy bruising or easy bleeding. Patient denies any recent infections, allergies or URI. Patient "s visual fields have not changed significantly in recent time.   PHYSICAL EXAM: BP (!) 150/73 (BP Location: Left Arm)   Pulse (!) 50   Temp 97.9 F (36.6 C)   Resp 15   Ht 5\' 11"  (1.803 m)   Wt 150 lb (68 kg)   SpO2 100%   BMI 20.92 kg/m  Crude visual fields within normal range motor sensory and DTR levels are equal symmetric in upper lower extremities.  Proprioception is intact.  Well-developed well-nourished patient in NAD. HEENT reveals PERLA, EOMI, discs not visualized.  Oral cavity is clear. No oral mucosal lesions are identified. Neck is clear without evidence of cervical or supraclavicular adenopathy. Lungs are  clear to A&P. Cardiac examination is essentially unremarkable with regular rate and rhythm without murmur rub or thrill. Abdomen is benign with no organomegaly or masses noted. Motor sensory and DTR levels are equal and symmetric in the upper and lower extremities. Cranial nerves II through XII are grossly intact. Proprioception is intact. No peripheral adenopathy or edema is identified. No motor or sensory levels are noted. Crude visual fields are within normal range.  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  I did discuss that he would probably need brain  radiation for his brain metastasis.  Patient understands his situation.  Should he be delayed in his transfer would be happy to reevaluate the patient at that time.  Patient's stepson is coming to [place] to get him back to [place]..  I would like to take this opportunity to thank you for allowing me to participate in the care of your patient.Noreene Filbert, MD

## 2020-09-28 NOTE — Sedation Documentation (Signed)
New probe introduced; procedure resumed

## 2020-09-29 ENCOUNTER — Encounter: Payer: Self-pay | Admitting: Oncology

## 2020-09-29 DIAGNOSIS — B9562 Methicillin resistant Staphylococcus aureus infection as the cause of diseases classified elsewhere: Secondary | ICD-10-CM | POA: Diagnosis not present

## 2020-09-29 DIAGNOSIS — D649 Anemia, unspecified: Secondary | ICD-10-CM | POA: Diagnosis not present

## 2020-09-29 DIAGNOSIS — A4102 Sepsis due to Methicillin resistant Staphylococcus aureus: Secondary | ICD-10-CM | POA: Diagnosis not present

## 2020-09-29 DIAGNOSIS — R7881 Bacteremia: Secondary | ICD-10-CM | POA: Diagnosis not present

## 2020-09-29 DIAGNOSIS — C7931 Secondary malignant neoplasm of brain: Secondary | ICD-10-CM | POA: Diagnosis not present

## 2020-09-29 DIAGNOSIS — R410 Disorientation, unspecified: Secondary | ICD-10-CM | POA: Diagnosis not present

## 2020-09-29 DIAGNOSIS — G9341 Metabolic encephalopathy: Secondary | ICD-10-CM | POA: Diagnosis not present

## 2020-09-29 LAB — GLUCOSE, CAPILLARY
Glucose-Capillary: 210 mg/dL — ABNORMAL HIGH (ref 70–99)
Glucose-Capillary: 443 mg/dL — ABNORMAL HIGH (ref 70–99)
Glucose-Capillary: 352 mg/dL — ABNORMAL HIGH (ref 70–99)
Glucose-Capillary: 194 mg/dL — ABNORMAL HIGH (ref 70–99)

## 2020-09-29 LAB — ECHOCARDIOGRAM COMPLETE
Height: 71 in
S' Lateral: 2.9 cm
Weight: 2400 oz

## 2020-09-29 LAB — SURGICAL PATHOLOGY

## 2020-09-29 LAB — CREATININE, SERUM
Creatinine, Ser: 0.75 mg/dL (ref 0.61–1.24)
GFR, Estimated: >60 mL/min (ref >60)

## 2020-09-29 MED ORDER — INSULIN GLARGINE 100 UNIT/ML ~~LOC~~ SOLN
10.0000 [IU] | Freq: Every day | SUBCUTANEOUS | Status: DC
  Administered 2020-09-29 – 2020-09-30 (×2): 10 [IU] via SUBCUTANEOUS
  Filled 2020-09-29 (×2): qty 0.1

## 2020-09-29 MED ORDER — DEXAMETHASONE 4 MG PO TABS
4.0000 mg | ORAL_TABLET | Freq: Two times a day (BID) | ORAL | Status: DC
  Administered 2020-09-29 – 2020-09-30 (×2): 4 mg via ORAL
  Filled 2020-09-29 (×3): qty 1

## 2020-09-29 MED ORDER — INSULIN ASPART 100 UNIT/ML ~~LOC~~ SOLN
3.0000 [IU] | Freq: Once | SUBCUTANEOUS | Status: AC
  Administered 2020-09-29: 3 [IU] via SUBCUTANEOUS

## 2020-09-29 MED ORDER — LACTULOSE 10 GM/15ML PO SOLN
20.0000 g | Freq: Once | ORAL | Status: AC
  Administered 2020-09-29: 20 g via ORAL
  Filled 2020-09-29: qty 30

## 2020-09-29 MED ORDER — INSULIN ASPART 100 UNIT/ML ~~LOC~~ SOLN: 3.0000 [IU] | Freq: Once | SUBCUTANEOUS | Status: DC

## 2020-09-29 MED ORDER — ORITAVANCIN DIPHOSPHATE 400 MG IV SOLR
1200.0000 mg | Freq: Once | INTRAVENOUS | Status: AC
  Administered 2020-09-29: 1200 mg via INTRAVENOUS
  Filled 2020-09-29: qty 120

## 2020-09-29 NOTE — Progress Notes (Signed)
Date of Admission:  09/16/2020      ID: Rick Marquez is a 80 y.o. 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 Chlorhexidine Gluconate Cloth  6 each Topical Daily  . dexamethasone  4 mg Oral Q12H  . enoxaparin (LOVENOX) injection  40 mg Subcutaneous Q24H  . feeding supplement  237 mL Oral BID BM  . insulin aspart  0-9 Units Subcutaneous TID AC & HS  . insulin glargine  10 Units Subcutaneous Daily  . iron polysaccharides  150 mg Oral Daily  . QUEtiapine  25 mg Oral QHS    Objective: Vital signs in last 24 hours: Temp:  [97.3 F (36.3 C)-98.2 F (36.8 C)] 97.6 F (36.4 C) (03/15 1530) Pulse Rate:  [56-93] 75 (03/15 1530) Resp:  [15-20] 16 (03/15 1530) BP: (117-155)/(58-87) 155/87 (03/15 1530) SpO2:  [99 %-100 %] 100 % (03/15 1530)  PHYSICAL EXAM:  General: Alert, cooperative, no distress, appears stated age.  Head: Normocephalic, without obvious abnormality, atraumatic. Eyes: Conjunctivae clear, anicteric sclerae. Pupils are equal ENT Nares normal. No drainage or sinus tenderness. Lips, mucosa, and tongue normal. No Thrush Neck: Supple, symmetrical, no adenopathy, thyroid: non tender no carotid bruit and no JVD. Back: No CVA tenderness. Lungs: Clear to auscultation bilaterally. No Wheezing or Rhonchi. No rales. Heart: Regular rate and rhythm, no murmur, rub or gallop. Abdomen: Soft, non-tender,not distended. Bowel sounds normal. No masses Extremities: atraumatic, no cyanosis. No edema. No clubbing Skin: No rashes or lesions. Or bruising Lymph: Cervical, supraclavicular  normal. Neurologic: Grossly non-focal  Lab Results Recent Labs    09/28/20 0502 09/29/20 0453  WBC 11.2*  --   HGB 9.2*  --   HCT 29.9*  --   NA 133*  --   K 4.5  --   CL 100  --   CO2 25  --   BUN 28*  --   CREATININE 0.75 0.75  Microbiology: 09/16/20 [facility] 4/4 MRSA 09/18/20 [facility] -[facility] Studies/Results: ECHOCARDIOGRAM COMPLETE  Result Date: 09/29/2020    ECHOCARDIOGRAM REPORT   Patient Name:   Rick Marquez Date of Exam: 09/28/2020 Medical Rec #:  [id number]      Height:       71.0 in Accession #:    [id number]     Weight:       150.0 lb Date of Birth:  [birth year]       BSA:          1.866 m Patient Age:    43 years       BP:           150/73 mmHg Patient Gender: M              HR:           50 bpm. 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 Risk  Factors:Diabetes.  Sonographer:     Alyse Low Roar Referring Phys:  [id number] Tsosie Billing Diagnosing Phys: Nelva Bush MD IMPRESSIONS  1. No obvious vegetation, though visualization of the valves is limited.  2. Left ventricular ejection fraction, by estimation, is 55 to 60%. The left ventricle has normal function. Left ventricular endocardial border not optimally defined to evaluate regional wall motion. There is mild left ventricular hypertrophy.  3. Right ventricular systolic function is normal. The right ventricular size is normal.  4. The mitral valve is grossly normal. Mild mitral valve regurgitation.  5. The aortic valve was not well visualized. Aortic valve regurgitation is mild. FINDINGS  Left Ventricle: Left ventricular ejection fraction, by estimation, is 55 to 60%. The left ventricle has normal function. Left ventricular endocardial border not optimally defined to evaluate regional wall motion. The left ventricular internal cavity size was normal in size. There is mild left  ventricular hypertrophy. Right Ventricle: The right ventricular size is normal. Right ventricular systolic function is normal. Mitral Valve: The mitral valve is grossly normal. Mild mitral valve regurgitation. Tricuspid Valve: The tricuspid valve is grossly normal. Tricuspid valve regurgitation is trivial. Aortic Valve: The aortic valve was not well visualized. Aortic valve regurgitation is mild. Pulmonic Valve: The pulmonic valve was not well visualized. Pulmonic valve regurgitation is not visualized. No evidence of pulmonic stenosis. Aorta: The aortic root is normal in size and structure.  LEFT VENTRICLE PLAX 2D LVIDd:         3.88 cm LVIDs:         2.90 cm LV PW:         1.24 cm LV IVS:        1.03 cm  LEFT ATRIUM         Index LA diam:    3.55 cm 1.90 cm/m                        PULMONIC VALVE AORTA                 PV Vmax:       0.94 m/s Ao Root diam: 3.00 cm PV Peak grad:  3.5 mmHg  TRICUSPID VALVE TR Peak grad:   22.5 mmHg TR Vmax:        237.00 cm/s Nelva Bush MD Electronically signed by Nelva Bush MD Signature Date/Time: 09/29/2020/12:29:20 PM    Final    ECHO TEE  Result Date: 09/28/2020    TRANSESOPHOGEAL ECHO REPORT   Patient Name:   Rick Marquez Date of Exam: 09/28/2020 Medical Rec #:  [id number]      Height:       71.0 in Accession #:    [id number]     Weight:       150.0 lb Date of Birth:  04-25-1941       BSA:          1.866 m Patient Age:    21 years       BP:           127/81 mmHg Patient Gender: M              HR:           71 bpm. Exam Location:  [facility] Procedure: Transesophageal Echo Indications:     Bacteremia 790.7/ R78.81  History:         Patient has prior history of Echocardiogram examinations, most  recent 09/18/2020. Risk Factors:Diabetes.  Sonographer:     Sherrie Sport RDCS (AE) Referring Phys:  [id number] Arvil Chaco Diagnosing Phys: Kathlyn Sacramento MD PROCEDURE: The transesophogeal probe was passed without difficulty through the esophogus of the patient. Local  oropharyngeal anesthetic was provided with Benzocaine spray and viscous lidocaine. Sedation performed by performing physician. The patient developed no complications during the procedure. The probe passed with moderate difficulty. I advanced the probe 30-40 cm but was not able to get any cardiac images in spite of changing angulation. I changed to a different probe and machine but had the same result. The aorta was seen without difficulty. The procedure was aborted. I reviewed recent CT chest which showed large hiatal hernia with large part of the stomach in the chest. This is the likely cause of inability to visualize the heart. IMPRESSIONS  1. The probe passed with moderate difficulty. I advanced the probe 30-40 cm but was not able to get any cardiac images in spite of changing angulation. I changed to a different probe and machine but had the same result. The aorta was seen without difficulty. The procedure was aborted. I reviewed recent CT chest which showed large hiatal hernia with large part of the stomach in the chest. This is the likely cause of inability to visualize the heart. Kathlyn Sacramento MD Electronically signed by Kathlyn Sacramento MD Signature Date/Time: 09/28/2020/12:30:30 PM    Final      Assessment/Plan: MRSA bacteremia.  On vancomycin day 13.  2D echo was a limited study but the valves had no vegetation.  TEE was attempted but could not visualize the valves due to hiatus hernia.  Marland Kitchen  will give a dose of oritavancin which is a long-acting glycopeptide and discharge him on oral linezolid for 2 more weeks.   Patient has extensive metastatic malignancy.pathology shows neuroendocrine malignancy   Cerebral mets, lung lesions and lymphadenopathy in the abdomen.   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  Encephalopathy resolved Discussed the management with the care team. Will discuss with his step daughter about the  management plan

## 2020-09-29 NOTE — Progress Notes (Signed)
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 Unclear if GI or lung origin. He will require systemic chemotherapy and XRT to the brain upon his return to [place].  Appreciate consult.
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
## 2020-09-29 NOTE — Progress Notes (Signed)
Inpatient Diabetes Program Recommendations  AACE/ADA: New Consensus Statement on Inpatient Glycemic Control (2015)  Target Ranges:  Prepandial:   less than 140 mg/dL      Peak postprandial:   less than 180 mg/dL (1-2 hours)      Critically ill patients:  140 - 180 mg/dL   Results for Rick Marquez, Rick Marquez (MRN [id number]) as of 09/29/2020 08:05  Ref. Range 09/28/2020 07:57 09/28/2020 12:10 09/28/2020 14:25 09/28/2020 16:39 09/28/2020 20:08 09/28/2020 21:45  Glucose-Capillary Latest Ref Range: 70 - 99 mg/dL 192 (H)  NOVOLOG HELD 231 (H) 159 (H)  2 units NOVOLOG  234 (H)  3 units NOVOLOG  268 (H) 305 (H)  7 units NOVOLOG    Results for Rick Marquez, Rick Marquez (MRN [id number]) as of 09/29/2020 08:05  Ref. Range 09/29/2020 07:54  Glucose-Capillary Latest Ref Range: 70 - 99 mg/dL 210 (H)       History of Diabetes--Current A1c= 6.2%   Pt unsure of home Diabetes Meds  Current Orders: Novolog 0-9 units TID ac/hs    Decadron 4 mg Q8 hours    MD- Please consider adding Lantus 6 units Daily while patient getting Decadron  (0.1 units/kg to start)    --Will follow patient during hospitalization--  Wyn Quaker RN, MSN, CDE Diabetes Coordinator Inpatient Glycemic Control Team Team Pager: [phone] (8a-5p)

## 2020-09-29 NOTE — Progress Notes (Addendum)
PROGRESS NOTE    Rick Marquez  LOV:[id number] DOB: 1941-02-02 DOA: 09/16/2020 PCP: Patient, No Pcp Per   Chief complaint.  Altered mental status. Brief Narrative:   [name] a 80 y.o.malewithunknown medical history who presents to the ED for evaluation of confusion. History is limited from patient due to confusion.In the emergency room, he met sepsis criteria with tachycardia, tachypnea and leukocytosis. His blood culture was positive for MRSA. He is covered with vancomycin. Patient has some lower back pain, MRI of the lumbar spine did not show evidence of discitis. 3/10.MRI of the brain with contrast showed multiple metastatic lesions. CT scan chest/abdomen/pelvis showed mass in the lungs, abdomen. Oncology consult obtained. 3/11.Biopsy of right inguinal lymph node performed. 3/14.  TEE: Not completed due to hiatal hernia.  Repeated TTE did not see any vegetation. 3/15.  Patient has been evaluated by PT/OT, no nursing home needed.  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  Sepsis with MRSA septicemia. Etiology still not clear.  Spinal MRI did not show any discitis.  TEE could not be performed due to hiatal hernia.  Patient had  2 TTEs, no vegetation.  Patient initially treated with vancomycin, was given a dose of oritavancin today.  He will need Zyvox 651m bid x 2 weeks. CBC/CMP after 1 week.  2. Delirium with acute metabolic encephalopathy. 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 Pelvic mass with subsequent hydronephrosis. Urinary  retention. Patient has been evaluated by urology, currently has a Foley catheter.  No plan for intervention as patient does not have worsening renal function. He had a biopsy of lymph nodes, still pending results. Patient he will need a follow-up with oncology as outpatient after going home in [place] Decrease Decadron to 4 mg twice a day.  #3.  Iron deficient anemia. Hemoglobin stable  4.  Urinary tract infection clinically resolved  5.  Hyponatremia secondary to SIADH. Stable.  6. uncontrolled type 2 diabetes with hyperglycemia Elevated glucose due to steroids.  Continue sliding scale insulin, decrease Decadron 4 mg twice a day.  Social worker is working with family, patient home is in [place]  Most likely he will be discharged home tomorrow after arrangement of transport and followed by sEducation officer, museum    DVT prophylaxis: Lovenox Code Status: Full Family Communication:  Disposition Plan:  .   Status is: Inpatient  Remains inpatient appropriate because:Inpatient level of care appropriate due to severity of illness   Dispo: The patient is from: Home              Anticipated d/c is to: Home              Patient currently is not medically stable to d/c.   Difficult to place patient No        I/O last 3 completed shifts: In: 580.6 [I.V.:80.6; IV Piggyback:500] Out: 48841[Urine:4050] Total I/O In: -  Out: 450 [Urine:450]     Consultants:   ID, oncology.  Cardiology.  Procedures: Left node biopsy.  Antimicrobials:   Subjective: Patient doing  well today, he is sleeping much better with the Seroquel.  No additional confusion. Denies any short of breath or cough. No fever chills No dysuria hematuria, Foley catheter still in place.  Objective: Vitals:   09/29/20 0057 09/29/20 0525 09/29/20 0752 09/29/20 1141  BP: 119/71 131/78 120/76 117/71  Pulse: 66 (!) 57 (!) 56 93  Resp: _0 Temp: 97.6 F (36.4 C) (!) 97.3 F (36.3 C) 97.9 F (36.6 C)  97.9 F (36.6 C)  TempSrc: Oral Oral    SpO2: 99% 100% 100% 99%  Weight:      Height:        Intake/Output Summary (Last 24 hours) at 09/29/2020 1336 Last data filed at 09/29/2020 0701 Gross per 24 hour  Intake --  Output 2500 ml  Net -2500 ml   Filed Weights   09/16/20 1940  Weight: 68 kg    Examination:  General exam: Appears calm and comfortable  Respiratory system: Clear to auscultation. Respiratory effort normal. Cardiovascular system: S1 & S2 heard, RRR. No JVD, murmurs, rubs, gallops or clicks. No pedal edema. Gastrointestinal system: Abdomen is nondistended, soft and nontender. No organomegaly or masses felt. Normal bowel sounds heard. Central nervous system: Alert and oriented. No focal neurological deficits. Extremities: Symmetric 5 x 5 power. Skin: No rashes, lesions or ulcers Psychiatry: Judgement and insight appear normal. Mood & affect appropriate.     Data Reviewed: I have personally reviewed following labs and imaging studies  CBC: Recent Labs  Lab 09/23/20 0534 09/24/20 0606 09/25/20 0142 09/28/20 0502  WBC 10.2 5.2 10.0 11.2*  NEUTROABS 7.7 3.7 8.1* 8.5*  HGB 8.3* 8.5* 8.2* 9.2*  HCT 26.6* 27.9* 26.7* 29.9*  MCV 76.7* 78.2* 77.4* 77.3*  PLT 333 323 381 509   Basic Metabolic Panel: Recent Labs  Lab 09/23/20 0534 09/24/20 0606 09/25/20 0142 09/26/20 0431 09/28/20 0502 09/29/20 0453  NA 135 138 131* 132* 133*  --   K 4.1 4.2 4.7 4.3 4.5  --   CL 103 105 100 100 100  --   CO2 _1 --   GLUCOSE 100* 199* 237* 163* 230*  --   BUN _2 24* 28*  --   CREATININE 0.73 0.78 0.76 0.69 0.75 0.75  CALCIUM 8.0* 8.2* 8.1* 8.1* 8.2*  --   MG 1.9 2.3  --  2.0 2.1  --    GFR: Estimated Creatinine Clearance: 73.2 mL/min (by C-G formula based on SCr of 0.75 mg/dL). Liver Function Tests: No results for input(s): AST, ALT, ALKPHOS, BILITOT, PROT, ALBUMIN in the last 168 hours. No results for input(s): LIPASE, AMYLASE in the last 168  hours. No results for input(s): AMMONIA in the last 168 hours. Coagulation Profile: No results for input(s): INR, PROTIME in the last 168 hours. Cardiac Enzymes: No results for input(s): CKTOTAL, CKMB, CKMBINDEX, TROPONINI in the last 168 hours. BNP (last 3 results) No results for input(s): PROBNP in the last 8760 hours. HbA1C: No results for input(s): HGBA1C in the last 72 hours. CBG: Recent Labs  Lab 09/28/20 1639 09/28/20 2008 09/28/20 2145 09/29/20 0754 09/29/20 1143  GLUCAP 234* 268* 305* 210* 443*   Lipid Profile: No results for input(s): CHOL, HDL, LDLCALC, TRIG, CHOLHDL, LDLDIRECT in the last 72 hours. Thyroid Function Tests: No results for input(s): TSH, T4TOTAL, FREET4, T3FREE, THYROIDAB in the last 72 hours. Anemia Panel: No results for input(s): VITAMINB12, FOLATE, FERRITIN, TIBC, IRON, RETICCTPCT in the last 72 hours.  Sepsis Labs: No results for input(s): PROCALCITON, LATICACIDVEN in the last 168 hours.  No results found for this or any previous visit (from the past 240 hour(s)).       Radiology Studies: ECHOCARDIOGRAM COMPLETE  Result Date: 09/29/2020    ECHOCARDIOGRAM REPORT   Patient Name:   Rodrigues Jenetta Downer Chim Date of Exam: 09/28/2020 Medical Rec #:  [id number]      Height:       71.0 in Accession #:    [id number]     Weight:       150.0 lb Date of Birth:  11-20-1940       BSA:          1.866 m Patient Age:    80 years       BP:           150/73 mmHg Patient Gender: M              HR:           50 bpm. 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 Risk                  Factors:Diabetes.  Sonographer:     Alyse Low Roar Referring Phys:  [id number] Tsosie Billing Diagnosing Phys: Nelva Bush MD IMPRESSIONS  1. No obvious vegetation, though visualization of the valves is limited.  2. Left ventricular ejection fraction, by  estimation, is 55 to 60%. The left ventricle has normal function. Left ventricular endocardial border not optimally defined to evaluate regional wall motion. There is mild left ventricular hypertrophy.  3. Right ventricular systolic function is normal. The right ventricular size is normal.  4. The mitral valve is grossly normal. Mild mitral valve regurgitation.  5. The aortic valve was not well visualized. Aortic valve regurgitation is mild. FINDINGS  Left Ventricle: Left ventricular ejection fraction, by estimation, is 55 to 60%. The left ventricle has normal function. Left ventricular endocardial border not optimally defined to evaluate regional wall motion. The left ventricular internal cavity size was normal in size. There is mild left ventricular hypertrophy. Right Ventricle: The right ventricular size is normal. Right ventricular systolic function is normal. Mitral Valve: The mitral valve is grossly normal. Mild mitral valve regurgitation. Tricuspid Valve: The tricuspid valve is grossly normal. Tricuspid valve regurgitation is trivial. Aortic Valve: The aortic valve was not well visualized. Aortic valve regurgitation is mild. Pulmonic Valve: The pulmonic valve was not well visualized. Pulmonic valve regurgitation is not visualized. No evidence of pulmonic stenosis. Aorta: The aortic root is normal in size and structure.  LEFT VENTRICLE PLAX 2D LVIDd:         3.88 cm LVIDs:         2.90 cm LV PW:         1.24 cm LV IVS:        1.03 cm  LEFT ATRIUM         Index LA diam:    3.55 cm 1.90 cm/m                        PULMONIC VALVE AORTA                 PV Vmax:       0.94 m/s Ao Root diam: 3.00 cm PV  Peak grad:  3.5 mmHg  TRICUSPID VALVE TR Peak grad:   22.5 mmHg TR Vmax:        237.00 cm/s Nelva Bush MD Electronically signed by Nelva Bush MD Signature Date/Time: 09/29/2020/12:29:20 PM    Final    ECHO TEE  Result Date: 09/28/2020    TRANSESOPHOGEAL ECHO REPORT   Patient Name:   Keno Jenetta Downer Fines Date  of Exam: 09/28/2020 Medical Rec #:  [id number]      Height:       71.0 in Accession #:    [id number]     Weight:       150.0 lb Date of Birth:  [birth year]       BSA:          1.866 m Patient Age:    29 years       BP:           127/81 mmHg Patient Gender: M              HR:           71 bpm. Exam Location:  [facility] Procedure: Transesophageal Echo Indications:     Bacteremia 790.7/ R78.81  History:         Patient has prior history of Echocardiogram examinations, most                  recent 09/18/2020. Risk Factors:Diabetes.  Sonographer:     Sherrie Sport RDCS (AE) Referring Phys:  [id number] Arvil Chaco Diagnosing Phys: Kathlyn Sacramento MD PROCEDURE: The transesophogeal probe was passed without difficulty through the esophogus of the patient. Local oropharyngeal anesthetic was provided with Benzocaine spray and viscous lidocaine. Sedation performed by performing physician. The patient developed no complications during the procedure. The probe passed with moderate difficulty. I advanced the probe 30-40 cm but was not able to get any cardiac images in spite of changing angulation. I changed to a different probe and machine but had the same result. The aorta was seen without difficulty. The procedure was aborted. I reviewed recent CT chest which showed large hiatal hernia with large part of the stomach in the chest. This is the likely cause of inability to visualize the heart. IMPRESSIONS  1. The probe passed with moderate difficulty. I advanced the probe 30-40 cm but was not able to get any cardiac images in spite of changing angulation. I changed to a different probe and machine but had the same result. The aorta was seen without difficulty. The procedure was aborted. I reviewed recent CT chest which showed large hiatal hernia with large part of the stomach in the chest. This is the likely cause of inability to visualize the heart. Kathlyn Sacramento MD Electronically signed by Kathlyn Sacramento MD Signature Date/Time:  09/28/2020/12:30:30 PM    Final         Scheduled Meds: . Chlorhexidine Gluconate Cloth  6 each Topical Daily  . dexamethasone  4 mg Oral Q12H  . enoxaparin (LOVENOX) injection  40 mg Subcutaneous Q24H  . feeding supplement  237 mL Oral BID BM  . insulin aspart  0-9 Units Subcutaneous TID AC & HS  . iron polysaccharides  150 mg Oral Daily  . QUEtiapine  25 mg Oral QHS   Continuous Infusions: . sodium chloride Stopped (09/27/20 0138)  . oritavancin (ORBACTIV) IVPB 1,200 mg (09/29/20 1221)     LOS: 12 days    Time spent: 28 minutes    Sharen Hones, MD [facility]  To contact the attending provider between 7A-7P or the covering provider during after hours 7P-7A, please log into the web site [email or web address] and access using universal [place] password for that web site. If you do not have the password, please call the hospital operator.  09/29/2020, 1:36 PM

## 2020-09-29 NOTE — Evaluation (Signed)
Physical Therapy Evaluation Patient Details Name: Rick Marquez MRN: [id number] DOB: July 27, 1940 Today's Date: 09/29/2020   History of Present Illness  80 y/o male here with acute delirium.  Apparently he was en route from [facility] to his home in [place] and was stopped at a [employer] here in [place].  Brought in by local police due to AMS, imaging reveals mets t/o body including brain.  Clinical Impression  Pt with some impulsivity and need for PT to keep him on task, but overall he moved very well with good confidence and relative safety.  He was able to get into the bathroom and clean up after BM w/o assist and then was eager to do a prolonged bout of ambulation, ultimately he easily did 350 ft w/o AD and with no LOBs or overt safety concerns.  He did not have excessive fatigue and was able to confidently change speed and direction t/o the effort.  He did have elevated HR up to the 140s with only minimal subjective fatigue, O2 remains in the high 90s on room air.  Good overall effort, would benefit from intermittent supervision but from a PT stand-point did relatively well.    Follow Up Recommendations No PT follow up;Supervision - Intermittent    Equipment Recommendations  None recommended by PT    Recommendations for Other Services       Precautions / Restrictions Precautions Precautions: Fall Restrictions Weight Bearing Restrictions: No      Mobility  Bed Mobility Overal bed mobility: Independent                  Transfers Overall transfer level: Independent Equipment used: None             General transfer comment: Pt able to easily transition to standing, no hesistantion, no unsteadiness, good confidence  Ambulation/Gait Ambulation/Gait assistance: Supervision Gait Distance (Feet): 350 Feet Assistive device: None       General Gait Details: Pt with somewhat stooped posture (baseline) but was able to easily do 2 loops w/o break and w/o UE support.  He variously would  walk slower self selected pace and then speed up (nearly jogging) just to show how good he felt.  Pt with O2 in the high 90s t/o the session, but did have elevated HR (into the 140s) with only minimal c/o fatigue.  Stairs            Wheelchair Mobility    Modified Rankin (Stroke Patients Only)       Balance Overall balance assessment: Independent                                           Pertinent Vitals/Pain Pain Assessment: No/denies pain    Home Living Family/patient expects to be discharged to:: Private residence Living Arrangements: Spouse/significant other Available Help at Discharge:  (wife apparently in rehab w/ Covid)             Additional Comments: Pt would need to either fly or drive back to [place], apparently family coming in from [facility] to assist?    Prior Function Level of Independence: Independent         Comments: Pt reports that his wife was the one typically doing errands/groceries/etc but that he drives and can do all he needs w/o assist.     Hand Dominance        Extremity/Trunk  Assessment   Upper Extremity Assessment Upper Extremity Assessment: Overall WFL for tasks assessed;Generalized weakness    Lower Extremity Assessment Lower Extremity Assessment: Overall WFL for tasks assessed;Generalized weakness       Communication   Communication: No difficulties  Cognition Arousal/Alertness: Awake/alert Behavior During Therapy: WFL for tasks assessed/performed;Impulsive Overall Cognitive Status: Within Functional Limits for tasks assessed                                 General Comments: Pt aware to self, that it is mid March, that he is in a hospital in [place] and that he's been here "more than a week"      General Comments General comments (skin integrity, edema, etc.): Pt eager to get up and do some activity/walking, showed some mild impulsivity but ultimately showed good phyiscal function and mobility     Exercises     Assessment/Plan    PT Assessment Patient needs continued PT services  PT Problem List Decreased strength;Decreased range of motion;Decreased activity tolerance;Decreased safety awareness;Decreased cognition;Decreased coordination       PT Treatment Interventions Gait training;Functional mobility training;Therapeutic activities;Therapeutic exercise;Balance training;Stair training;Cognitive remediation;Patient/family education;Neuromuscular re-education    PT Goals (Current goals can be found in the Care Plan section)  Acute Rehab PT Goals Patient Stated Goal: pt intent on going back to [facility] today... PT Goal Formulation: With patient Time For Goal Achievement: 10/13/20 Potential to Achieve Goals: Good    Frequency Min 2X/week   Barriers to discharge        Co-evaluation               AM-PAC PT "6 Clicks" Mobility  Outcome Measure Help needed turning from your back to your side while in a flat bed without using bedrails?: None Help needed moving from lying on your back to sitting on the side of a flat bed without using bedrails?: None Help needed moving to and from a bed to a chair (including a wheelchair)?: None Help needed standing up from a chair using your arms (e.g., wheelchair or bedside chair)?: None Help needed to walk in hospital room?: A Little Help needed climbing 3-5 steps with a railing? : A Little 6 Click Score: 22    End of Session Equipment Utilized During Treatment: Gait belt Activity Tolerance: Patient tolerated treatment well (HR elevated with activity (140s) but no excessive fatigue, etc) Patient left: with chair alarm set;with call bell/phone within reach Nurse Communication: Mobility status PT Visit Diagnosis: Unsteadiness on feet (R26.81);Muscle weakness (generalized) (M62.81)    Time: 2956-2130 PT Time Calculation (min) (ACUTE ONLY): 20 min   Charges:   PT Evaluation $PT Eval Low Complexity: 1 Low PT Treatments $Gait  Training: 8-22 mins        Kreg Shropshire, DPT 09/29/2020, 10:58 AM

## 2020-09-29 NOTE — TOC Progression Note (Signed)
Transition of Care [facility]) - Progression Note    Patient Details  Name: Rick Marquez MRN: [id number] Date of Birth: 08/26/1940  Transition of Care [facility]) CM/SW Contact  Su Hilt, RN Phone Number: 09/29/2020, 3:51 PM  Clinical Narrative:      Spoke with the patient's son Rick Marquez from [place], He stated that he is coming to pick up the patient and it is at least a 9 hour drive. Patient will DC with Rick Marquez who plans to drive him to [facility] \, notified the physician      Expected Discharge Plan and Services                                                 Social Determinants of Health (SDOH) Interventions    Readmission Risk Interventions No flowsheet data found.

## 2020-09-30 ENCOUNTER — Encounter: Payer: Self-pay | Admitting: Cardiovascular Disease

## 2020-09-30 DIAGNOSIS — R410 Disorientation, unspecified: Secondary | ICD-10-CM | POA: Diagnosis not present

## 2020-09-30 LAB — BASIC METABOLIC PANEL
Anion gap: 9 (ref 5–15)
BUN: 26 mg/dL — ABNORMAL HIGH (ref 8–23)
CO2: 28 mmol/L (ref 22–32)
Calcium: 8.7 mg/dL — ABNORMAL LOW (ref 8.9–10.3)
Chloride: 97 mmol/L — ABNORMAL LOW (ref 98–111)
Creatinine, Ser: 0.85 mg/dL (ref 0.61–1.24)
GFR, Estimated: >60 mL/min (ref >60)
Glucose, Bld: 190 mg/dL — ABNORMAL HIGH (ref 70–99)
Potassium: 4.6 mmol/L (ref 3.5–5.1)
Sodium: 134 mmol/L — ABNORMAL LOW (ref 135–145)

## 2020-09-30 LAB — CBC WITH DIFFERENTIAL/PLATELET
Abs Immature Granulocytes: 0.22 10*3/uL — ABNORMAL HIGH (ref 0.00–0.07)
Basophils Absolute: 0 10*3/uL (ref 0.0–0.1)
Basophils Relative: 0 %
Eosinophils Absolute: 0 10*3/uL (ref 0.0–0.5)
Eosinophils Relative: 0 %
HCT: 35 % — ABNORMAL LOW (ref 39.0–52.0)
Hemoglobin: 10.9 g/dL — ABNORMAL LOW (ref 13.0–17.0)
Immature Granulocytes: 2 %
Lymphocytes Relative: 13 %
Lymphs Abs: 1.9 10*3/uL (ref 0.7–4.0)
MCH: 24.3 pg — ABNORMAL LOW (ref 26.0–34.0)
MCHC: 31.1 g/dL (ref 30.0–36.0)
MCV: 78 fL — ABNORMAL LOW (ref 80.0–100.0)
Monocytes Absolute: 0.6 10*3/uL (ref 0.1–1.0)
Monocytes Relative: 4 %
Neutro Abs: 12.1 10*3/uL — ABNORMAL HIGH (ref 1.7–7.7)
Neutrophils Relative %: 81 %
Platelets: 385 10*3/uL (ref 150–400)
RBC: 4.49 MIL/uL (ref 4.22–5.81)
RDW: 20 % — ABNORMAL HIGH (ref 11.5–15.5)
WBC: 14.8 10*3/uL — ABNORMAL HIGH (ref 4.0–10.5)
nRBC: 0 % (ref 0.0–0.2)

## 2020-09-30 LAB — GLUCOSE, CAPILLARY: Glucose-Capillary: 170 mg/dL — ABNORMAL HIGH (ref 70–99)

## 2020-09-30 LAB — MAGNESIUM: Magnesium: 2.2 mg/dL (ref 1.7–2.4)

## 2020-09-30 MED ORDER — DEXAMETHASONE 4 MG PO TABS: 4.0000 mg | ORAL_TABLET | Freq: Every day | ORAL | 0 refills | Status: AC

## 2020-09-30 MED ORDER — INSULIN GLARGINE 100 UNIT/ML ~~LOC~~ SOLN: 10.0000 [IU] | Freq: Every day | SUBCUTANEOUS | 0 refills | Status: AC

## 2020-09-30 MED ORDER — LINEZOLID 600 MG PO TABS: 600.0000 mg | ORAL_TABLET | Freq: Two times a day (BID) | ORAL | 0 refills | Status: AC

## 2020-09-30 NOTE — Discharge Summary (Signed)
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 Mild mitral valve regurgitation.  4. No valve vegetation noted. FINDINGS  Left Ventricle: Left ventricular ejection fraction, by estimation, is 55 to 60%. The left ventricle has normal function. The left ventricle has no regional wall motion abnormalities. The average left ventricular global longitudinal strain is -11.7 %. The left ventricular internal cavity size was normal in size. There is no left ventricular hypertrophy. Left ventricular diastolic parameters are consistent with Grade I diastolic dysfunction (impaired relaxation). Right Ventricle: The right ventricular size is normal. No increase in right ventricular wall thickness. Right ventricular systolic function is normal. Left Atrium: Left atrial size was normal in size. Right Atrium: Right atrial size was normal in size. Pericardium: There is no evidence of pericardial effusion. Mitral Valve: The mitral valve is normal in structure. Mild mitral valve regurgitation. No evidence of mitral valve stenosis. MV peak gradient, 3.2 mmHg. The mean mitral valve gradient is 2.0 mmHg. Tricuspid Valve: The tricuspid valve is normal in structure. Tricuspid valve regurgitation is not demonstrated. No evidence of  tricuspid stenosis. Aortic Valve: The aortic valve is normal in structure. Aortic valve regurgitation is mild. Aortic regurgitation PHT measures 542 msec. Mild to moderate aortic valve sclerosis/calcification is present, without any evidence of aortic stenosis. Aortic  valve  mean gradient measures 4.0 mmHg. Aortic valve peak gradient measures 9.2 mmHg. Aortic valve area, by VTI measures 4.60 cm. Pulmonic Valve: The pulmonic valve was normal in structure. Pulmonic valve regurgitation is not visualized. No evidence of pulmonic stenosis. Aorta: The aortic root is normal in size and structure. Venous: The inferior vena cava is normal in size with greater than 50% respiratory variability, suggesting right atrial pressure of 3 mmHg. IAS/Shunts: No atrial level shunt detected by color flow Doppler.  LEFT VENTRICLE PLAX 2D LVIDd:         4.90 cm  Diastology LVIDs:         3.70 cm  LV e' medial:    8.27 cm/s LV PW:         1.00 cm  LV E/e' medial:  11.5 LV IVS:        0.90 cm  LV e' lateral:   8.92 cm/s LVOT diam:     2.80 cm  LV E/e' lateral: 10.7 LV SV:         115 LV SV Index:   61       2D Longitudinal Strain LVOT Area:     6.16 cm 2D Strain GLS Avg:     -11.7 %  RIGHT VENTRICLE RV Basal diam:  2.30 cm LEFT ATRIUM           Index       RIGHT ATRIUM           Index LA Vol (A4C): 26.8 ml 14.36 ml/m RA Area:     12.10 cm                                   RA Volume:   24.00 ml  12.86 ml/m  AORTIC VALVE AV Area (Vmax):    3.94 cm AV Area (Vmean):   4.40 cm AV Area (VTI):     4.60 cm AV Vmax:           152.00 cm/s AV Vmean:          93.800 cm/s AV VTI:            0.249 m AV Peak Grad:      9.2 mmHg AV Mean Grad:      4.0 mmHg LVOT Vmax:         97.30 cm/s LVOT Vmean:        67.100 cm/s LVOT VTI:          0.186 m LVOT/AV VTI ratio: 0.75 AI PHT:            542 msec  AORTA Ao Root diam: 3.90 cm MITRAL VALVE MV Area (PHT): 5.34 cm    SHUNTS MV Area VTI:   6.30 cm    Systemic VTI:  0.19 m MV Peak grad:  3.2 mmHg     Systemic Diam: 2.80 cm MV Mean grad:  2.0 mmHg MV Vmax:       0.90 m/s MV Vmean:      65.5 cm/s MV Decel Time: 142 msec MV E velocity: 95.35 cm/s MV A velocity: 88.70 cm/s MV E/A ratio:  1.07 Ida Rogue MD Electronically signed by Ida Rogue MD Signature Date/Time: 09/18/2020/8:40:18 PM    Final    ECHO TEE  Result Date: 09/28/2020    TRANSESOPHOGEAL ECHO REPORT   Patient Name:   Ivy O Abbey Date of Exam: 09/28/2020 Medical Rec #:  [id number]      Height:       71.0 in Accession #:    [id number]     Weight:       150.0 lb Date of Birth:  04-08-1941       BSA:          1.866 m Patient Age:    80 years       BP:           127/81 mmHg Patient Gender: M              HR:           71 bpm. Exam Location:  [facility] Procedure: Transesophageal Echo Indications:     Bacteremia 790.7/ R78.81  History:         Patient has prior history of Echocardiogram examinations, most                  recent 09/18/2020. Risk Factors:Diabetes.  Sonographer:     Sherrie Sport RDCS (AE) Referring Phys:  [id number] Arvil Chaco Diagnosing Phys: Kathlyn Sacramento MD PROCEDURE: The transesophogeal probe was passed without difficulty through the esophogus of the patient. Local oropharyngeal anesthetic was provided with Benzocaine spray and viscous lidocaine. Sedation performed by performing physician. The patient developed no complications during the procedure. The probe passed with moderate difficulty. I advanced the probe 30-40 cm but was not able to get any cardiac images in spite of changing angulation. I changed to a different probe and machine but had the same result. The aorta was seen without difficulty. The procedure was aborted. I reviewed recent CT chest which showed large hiatal hernia with large part of the stomach in the chest. This is the likely cause of inability to visualize the heart. IMPRESSIONS  1. The probe passed with moderate difficulty. I advanced the probe 30-40 cm but was not able to get any cardiac images in spite of  changing angulation. I changed to a different probe and machine but had the same result. The aorta was seen without difficulty. The procedure was aborted. I reviewed recent CT chest which showed large hiatal hernia with large part of the stomach in the chest. This is the likely cause of inability to visualize the heart. 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 EXAM: ULTRASOUND GUIDED CORE NEEDLE BIOPSY BIOPSY OF RIGHT INGUINAL LYMPH NODE MEDICATIONS: None. ANESTHESIA/SEDATION: Fentanyl 50 mcg IV; Versed 1 mg IV Moderate Sedation Time:  15 MINUTES The patient was continuously monitored during the procedure by the interventional radiology nurse under my direct supervision. PROCEDURE: The procedure, risks, benefits, and alternatives were explained to the patient. Questions regarding the procedure were encouraged and answered. The patient understands and consents to the procedure. The right inguinal skin was prepped with chlorhexidine in a sterile fashion, and a sterile drape was applied covering the operative field. A sterile gown and sterile gloves were used for the procedure. Local anesthesia was provided with 1% Lidocaine. Following local lidocaine administration, three 18 gauge cores were obtained utilizing continuous ultrasound guidance. Samples were sent to pathology in sterile saline. Needle removed and hemostasis achieved with 2 minutes of manual compression. Post procedure ultrasound images showed no evidence of significant hemorrhage. COMPLICATIONS: None immediate. IMPRESSION: Ultrasound-guided biopsy abnormally enlarged right inguinal lymph node as above. Electronically Signed  By: Sharen Heck  Mir M.D.   On: 09/25/2020 16:51      Subjective: No chest pain, vomiting, fever, cough,  dyspnea.  Discharge Exam: Vitals:   09/30/20 0433 09/30/20 0744  BP: 132/79 128/77  Pulse: (!) 58 (!) 57  Resp: 18 15  Temp: (!) 97.5 F (36.4 C) 97.9 F (36.6 C)  SpO2: 100% 100%   Vitals:   09/29/20 2007 09/29/20 2349 09/30/20 0433 09/30/20 0744  BP: 116/72 132/79 132/79 128/77  Pulse: 73 77 (!) 58 (!) 57  Resp: 18 18 18 15   Temp: 97.7 F (36.5 C) 97.6 F (36.4 C) (!) 97.5 F (36.4 C) 97.9 F (36.6 C)  TempSrc: Oral     SpO2: 99% 98% 100% 100%  Weight:      Height:        General: Pt is alert, awake, not in acute distress, thin, hard of hearing Cardiovascular: RRR, nl S1-S2, no murmurs appreciated.   No LE edema.   Respiratory: Normal respiratory rate and rhythm.  CTAB without rales or wheezes. Abdominal: Abdomen soft and non-tender.  No distension or HSM.   Neuro/Psych: Strength symmetric in upper and lower extremities.  Judgment and insight appears confused.   The results of significant diagnostics from this hospitalization (including imaging, microbiology, ancillary and laboratory) are listed below for reference.     Microbiology: No results found for this or any previous visit (from the past 240 hour(s)).   Labs: BNP (last 3 results) No results for input(s): BNP in the last 8760 hours. Basic Metabolic Panel: Recent Labs  Lab 09/24/20 0606 09/25/20 0142 09/26/20 0431 09/28/20 0502 09/29/20 0453 09/30/20 0544  NA 138 131* 132* 133*  --  134*  K 4.2 4.7 4.3 4.5  --  4.6  CL 105 100 100 100  --  97*  CO2 25 25 24 25   --  28  GLUCOSE 199* 237* 163* 230*  --  190*  BUN 16 23 24* 28*  --  26*  CREATININE 0.78 0.76 0.69 0.75 0.75 0.85  CALCIUM 8.2* 8.1* 8.1* 8.2*  --  8.7*  MG 2.3  --  2.0 2.1  --  2.2   Liver Function Tests: No results for input(s): AST, ALT, ALKPHOS, BILITOT, PROT, ALBUMIN in the last 168 hours. No results for input(s): LIPASE, AMYLASE in the last 168 hours. No results for input(s): AMMONIA in the last 168 hours. CBC: Recent Labs   Lab 09/24/20 0606 09/25/20 0142 09/28/20 0502 09/30/20 0544  WBC 5.2 10.0 11.2* 14.8*  NEUTROABS 3.7 8.1* 8.5* 12.1*  HGB 8.5* 8.2* 9.2* 10.9*  HCT 27.9* 26.7* 29.9* 35.0*  MCV 78.2* 77.4* 77.3* 78.0*  PLT 323 381 363 385   Cardiac Enzymes: No results for input(s): CKTOTAL, CKMB, CKMBINDEX, TROPONINI in the last 168 hours. BNP: Invalid input(s): POCBNP CBG: Recent Labs  Lab 09/29/20 0754 09/29/20 1143 09/29/20 1610 09/29/20 2052 09/30/20 0743  GLUCAP 210* 443* 352* 194* 170*   D-Dimer No results for input(s): DDIMER in the last 72 hours. Hgb A1c No results for input(s): HGBA1C in the last 72 hours. Lipid Profile No results for input(s): CHOL, HDL, LDLCALC, TRIG, CHOLHDL, LDLDIRECT in the last 72 hours. Thyroid function studies No results for input(s): TSH, T4TOTAL, T3FREE, THYROIDAB in the last 72 hours.  Invalid input(s): FREET3 Anemia work up No results for input(s): VITAMINB12, FOLATE, FERRITIN, TIBC, IRON, RETICCTPCT in the last 72 hours. Urinalysis    Component Value Date/Time   COLORURINE AMBER (A) 09/16/2020  1947   APPEARANCEUR TURBID (A) 09/16/2020 1947   LABSPEC 1.027 09/16/2020 1947   PHURINE 5.0 09/16/2020 1947   GLUCOSEU NEGATIVE 09/16/2020 1947   [place] (A) 09/16/2020 [place] NEGATIVE 09/16/2020 [place] 09/16/2020 1947   PROTEINUR >=300 (A) 09/16/2020 1947   NITRITE NEGATIVE 09/16/2020 1947   LEUKOCYTESUR LARGE (A) 09/16/2020 1947   Sepsis Labs Invalid input(s): PROCALCITONIN,  WBC,  LACTICIDVEN Microbiology No results found for this or any previous visit (from the past 240 hour(s)).   Time coordinating discharge: 45 minutes       SIGNED:   Edwin Dada, MD  [facility] 09/30/2020, 8:56 PM

## 2020-09-30 NOTE — Progress Notes (Signed)
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  [place] for family to transport him back to [place].  To facilitate discharge, patient given oritavancin x 1 3/15 with plan for linezolid x 2 weeks  Benefit Check - linezolid 600mg  BID x 2 weeks copay = $5   Doreene Eland, PharmD, BCPS.   Work Cell: [phone] 09/30/2020 8:04 AM

## 2020-09-30 NOTE — Progress Notes (Signed)
Called his Step Daughter [name] to discuss linezolod and follow up with physician in [place]- she was not aware that patient was discharged. Told her that his step son picked him up and taking him to [place] and he will have to follow up with medical providers for malignancy and if any issues with antibiotic linezolid

## 2020-09-30 NOTE — Care Management Important Message (Signed)
Important Message  Patient Details  Name: Rick Marquez MRN: [id number] Date of Birth: Aug 12, 1940   Medicare Important Message Given:  Yes  Patient is in an isolation room so talked with him and son, [name] by phone who is here from [place] and going to transport patient home to [place].  I reviewed the Important Message from Medicare and patient is in agreement with the discharge.   Juliann Pulse A Gerry Heaphy 09/30/2020, 9:27 AM

## 2020-09-30 NOTE — Plan of Care (Signed)
  Problem: Education: Goal: Knowledge of General Education information will improve Description: Including pain rating scale, medication(s)/side effects and non-pharmacologic comfort measures Outcome: Adequate for Discharge   Problem: Health Behavior/Discharge Planning: Goal: Ability to manage health-related needs will improve Outcome: Adequate for Discharge   Problem: Clinical Measurements: Goal: Ability to maintain clinical measurements within normal limits will improve Outcome: Adequate for Discharge Goal: Will remain free from infection Outcome: Adequate for Discharge Goal: Diagnostic test results will improve Outcome: Adequate for Discharge Goal: Respiratory complications will improve Outcome: Adequate for Discharge Goal: Cardiovascular complication will be avoided Outcome: Adequate for Discharge   Problem: Activity: Goal: Risk for activity intolerance will decrease Outcome: Adequate for Discharge   Problem: Nutrition: Goal: Adequate nutrition will be maintained Outcome: Adequate for Discharge   Problem: Coping: Goal: Level of anxiety will decrease Outcome: Adequate for Discharge   Problem: Elimination: Goal: Will not experience complications related to bowel motility Outcome: Adequate for Discharge Goal: Will not experience complications related to urinary retention Outcome: Adequate for Discharge   Problem: Pain Managment: Goal: General experience of comfort will improve Outcome: Adequate for Discharge   Problem: Skin Integrity: Goal: Risk for impaired skin integrity will decrease Outcome: Adequate for Discharge   Problem: Activity: Goal: Interest or engagement in activities will improve Outcome: Adequate for Discharge Goal: Sleeping patterns will improve Outcome: Adequate for Discharge   Problem: Health Behavior/Discharge Planning: Goal: Identification of resources available to assist in meeting health care needs will improve Outcome: Adequate for  Discharge Goal: Compliance with treatment plan for underlying cause of condition will improve Outcome: Adequate for Discharge   Problem: Physical Regulation: Goal: Ability to maintain clinical measurements within normal limits will improve Outcome: Adequate for Discharge   Problem: Safety: Goal: Periods of time without injury will increase Outcome: Adequate for Discharge  Phone order recd form Dr Loleta Books to leave foley catheter in place as it is chronic

## 2021-05-18 DEATH — deceased
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
